# Patient Record
Sex: Female | Born: 2000 | Race: Black or African American | Hispanic: No | Marital: Single | Smoking: Never smoker
Health system: Southern US, Community
[De-identification: ages and names within clinical notes are randomized; demographics above are authoritative.]

## PROBLEM LIST (undated history)

## (undated) DIAGNOSIS — F32A Depression, unspecified: Secondary | ICD-10-CM

## (undated) DIAGNOSIS — N390 Urinary tract infection, site not specified: Secondary | ICD-10-CM

## (undated) DIAGNOSIS — D649 Anemia, unspecified: Secondary | ICD-10-CM

## (undated) DIAGNOSIS — A749 Chlamydial infection, unspecified: Secondary | ICD-10-CM

## (undated) HISTORY — DX: Chlamydial infection, unspecified: A74.9

## (undated) HISTORY — PX: NO PAST SURGERIES: SHX2092

## (undated) HISTORY — DX: Depression, unspecified: F32.A

---
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
## 2020-02-14 ENCOUNTER — Ambulatory Visit: Payer: Self-pay

## 2020-02-15 ENCOUNTER — Ambulatory Visit: Payer: Self-pay

## 2020-02-19 ENCOUNTER — Other Ambulatory Visit: Payer: Self-pay

## 2020-02-19 ENCOUNTER — Ambulatory Visit
Admission: RE | Admit: 2020-02-19 | Discharge: 2020-02-19 | Disposition: A | Discharge status: Home / Self Care | Payer: Medicaid Other | Source: Ambulatory Visit | Attending: Emergency Medicine | Admitting: Emergency Medicine

## 2020-02-19 VITALS — BP 138/78 | HR 88 | Temp 98.3°F | Resp 20

## 2020-02-19 DIAGNOSIS — N3001 Acute cystitis with hematuria: Secondary | ICD-10-CM | POA: Diagnosis present

## 2020-02-19 DIAGNOSIS — Z113 Encounter for screening for infections with a predominantly sexual mode of transmission: Secondary | ICD-10-CM | POA: Insufficient documentation

## 2020-02-19 LAB — POCT URINALYSIS DIP (MANUAL ENTRY)
Bilirubin, UA: NEGATIVE
Glucose, UA: NEGATIVE mg/dL
Ketones, POC UA: NEGATIVE mg/dL
Nitrite, UA: NEGATIVE
Spec Grav, UA: >=1.03 — AB (ref 1.010–1.025)
Urobilinogen, UA: 0.2 E.U./dL
pH, UA: 6.5 (ref 5.0–8.0)

## 2020-02-19 LAB — POCT URINE PREGNANCY: Preg Test, Ur: NEGATIVE

## 2020-02-19 MED ORDER — CEPHALEXIN 500 MG PO CAPS: 500.0000 mg | ORAL_CAPSULE | Freq: Two times a day (BID) | ORAL | 0 refills | Status: AC

## 2020-02-19 NOTE — ED Provider Notes (Signed)
[name]    CSN: [id number] Arrival date & time: 02/19/20  1608      History   Chief Complaint Chief Complaint  Patient presents with  . Urinary Frequency    X 1 week    HPI Jill Neal is a 19 y.o. female  Sending for possible UTI.  Endorsing urinary frequency, urgency, dysuria.  Has had this feeling for the last few days.  Also requesting STI check-currently asymptomatic.  Past Medical History:  Diagnosis Date  . Anemia   . Anemia in pregnancy, third trimester 03/06/2019   Hgb 9.8 at 28 week visit Rx for Ferrous Sulfate ordered  . Chlamydia   . Post-dates pregnancy 05/29/2019  . Pregnancy affected by fetal growth restriction 05/29/2019  . Short cervical length during pregnancy in second trimester 01/20/2019   2.7 cm on 01/20/19  . Supervision of low-risk pregnancy 11/12/2018   BABYSCRIPTS PATIENT: [x ]Initial [x ]12 [x20 [x] 28 [ ] 32 [ ] 36 [ ] 38 [ ] 39 [ ] 40  Nursing Staff Provider Office Location  [facility] Dating   LMP and 6.1 week Language   English Anatomy   Normal  Flu Vaccine  12/04/2018 Genetic Screen   NIPS: low risk  AFP:   negative  TDaP vaccine   03/05/19 Hgb A1C or  GTT Early 5.2 Third trimester 79-108-93 Rhogam   N/A   LAB RESULTS  Feeding Plan  Bottle Bloo  . UTI (lower urinary tract infection)     Patient Active Problem List   Diagnosis Date Noted  . 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 Depression 12/03/2018    Past Surgical History:  Procedure Laterality Date  . NO PAST SURGERIES      OB History    Gravida  1   Para  1   Term  1   Preterm  0   AB  0   Living  1     SAB  0   IAB  0   Ectopic  0   Multiple  0   Live Births  1            Home Medications    Prior to Admission medications   Medication Sig Start Date End Date Taking? Authorizing Provider  cephALEXin (KEFLEX) 500 MG capsule Take 1 capsule (500 mg total) by mouth 2 (two) times daily for 3 days. 02/19/20 02/22/20  Hall-Potvin,  05/03/19, PA-C  ferrous sulfate 325 (65 FE) MG tablet Take 1 tablet (325 mg total) by mouth 3 (three) times daily with meals. Patient not taking: No sig reported 03/10/19   01/15/2019, DO  montelukast (SINGULAIR) 10 MG tablet Take 1 tablet (10 mg total) by mouth at bedtime. Patient not taking: Reported on 10/03/2018 11/22/17 10/03/18  [place], MD    Family History Family History  Problem Relation Age of Onset  . Hypertension Other   . Healthy Mother   . Healthy Father     Social History Social History   Tobacco Use  . Smoking status: Never Smoker  . Smokeless tobacco: Never Used  Vaping Use  . Vaping Use: Never used  Substance Use Topics  . Alcohol use: No    Comment: occasional  . Drug use: Not Currently    Types: Marijuana    Comment: last used July     Allergies   Patient has no known allergies.   Review of Systems Review of Systems  Constitutional: Negative for fatigue and fever.  Respiratory: Negative for cough and shortness of breath.   Cardiovascular: Negative for chest pain and palpitations.  Gastrointestinal: Negative for constipation and diarrhea.  Genitourinary: Positive for dysuria, frequency and urgency. Negative for flank pain, hematuria, pelvic pain, vaginal bleeding, vaginal discharge and vaginal pain.     Physical Exam Triage Vital Signs ED Triage Vitals  Enc Vitals Group     BP 02/19/20 1625 138/78     Pulse Rate 02/19/20 1625 88     Resp 02/19/20 1625 20     Temp 02/19/20 1625 98.3 F (36.8 C)     Temp Source 02/19/20 1625 Oral     SpO2 02/19/20 1625 97 %     Weight --      Height --      Head Circumference --      Peak Flow --      Pain Score 02/19/20 1630 0     Pain Loc --      Pain Edu? --      Excl. in GC? --    No data found.  Updated Vital Signs BP 138/78 (BP Location: Left Arm)   Pulse 88   Temp 98.3 F (36.8 C) (Oral)   Resp 20   SpO2 97%   Visual Acuity Right Eye Distance:   Left Eye Distance:    Bilateral Distance:    Right Eye Near:   Left Eye Near:    Bilateral Near:     Physical Exam Constitutional:      General: She is not in acute distress. HENT:     Head: Normocephalic and atraumatic.  Eyes:     General: No scleral icterus.    Pupils: Pupils are equal, round, and reactive to light.  Cardiovascular:     Rate and Rhythm: Normal rate.  Pulmonary:     Effort: Pulmonary effort is normal.  Abdominal:     General: Bowel sounds are normal.     Palpations: Abdomen is soft.     Tenderness: There is no abdominal tenderness. There is no right CVA tenderness, left CVA tenderness or guarding.  Genitourinary:    Comments: Patient declined, self-swab performed Skin:    Coloration: Skin is not jaundiced or pale.  Neurological:     Mental Status: She is alert and oriented to person, place, and time.      UC Treatments / Results  Labs (all labs ordered are listed, but only abnormal results are displayed) Labs Reviewed  POCT URINALYSIS DIP (MANUAL ENTRY) - Abnormal; Notable for the following components:      Result Value   Clarity, UA cloudy (*)    Spec Grav, UA >=1.030 (*)    Blood, UA trace-intact (*)    Protein Ur, POC trace (*)    Leukocytes, UA Small (1+) (*)    All other components within normal limits  URINE CULTURE  POCT URINE PREGNANCY  CERVICOVAGINAL ANCILLARY ONLY    EKG   Radiology No results found.  Procedures Procedures (including critical care time)  Medications Ordered in UC Medications - No data to display  Initial Impression / Assessment and Plan / UC Course  I have reviewed the triage vital signs and the nursing notes.  Pertinent labs & imaging results that were available during my care of the patient were reviewed by me and considered in my medical decision making (see chart for details).     Urine as above, will treat empirically for UTI given symptoms.  Culture pending.  STI cytology  pending as well.  Return precautions discussed,  pt verbalized understanding and is agreeable to plan. Final Clinical Impressions(s) / UC Diagnoses   Final diagnoses:  Acute cystitis with hematuria     Discharge Instructions     Take antibiotic twice daily with food. Important to drink plenty of water throughout the day. May take Azo as needed for burning sensation. Return for worsening urinary symptoms, blood in urine, abdominal or back pain, fever.    ED Prescriptions    Medication Sig Dispense Auth. Provider   cephALEXin (KEFLEX) 500 MG capsule Take 1 capsule (500 mg total) by mouth 2 (two) times daily for 3 days. 6 capsule Hall-Potvin, [place], PA-C     PDMP not reviewed this encounter.   Hall-Potvin, [place], [place] 02/19/20 1700

## 2020-02-19 NOTE — ED Triage Notes (Signed)
Patient states she has a odd feeling when urinating and has some frequency. Pt also requested a STI check. Pt is aox4 and ambulatory.

## 2020-02-19 NOTE — Discharge Instructions (Addendum)
Take antibiotic twice daily with food. °Important to drink plenty of water throughout the day. °May take Azo as needed for burning sensation. °Return for worsening urinary symptoms, blood in urine, abdominal or back pain, fever. °

## 2020-02-23 ENCOUNTER — Telehealth: Payer: Self-pay

## 2020-02-23 NOTE — Telephone Encounter (Signed)
Lab unable to run urine culture. Per Jill Neal patient should only return for recollect if still symptomatic. Attempted to reach patient. No answer.

## 2020-02-24 LAB — CERVICOVAGINAL ANCILLARY ONLY
Chlamydia: NEGATIVE
Comment: NEGATIVE
Comment: NEGATIVE
Comment: NORMAL
Neisseria Gonorrhea: NEGATIVE
Trichomonas: NEGATIVE

## 2020-03-05 ENCOUNTER — Ambulatory Visit: Payer: Self-pay

## 2020-03-07 ENCOUNTER — Ambulatory Visit: Payer: Self-pay

## 2020-03-08 ENCOUNTER — Ambulatory Visit (INDEPENDENT_AMBULATORY_CARE_PROVIDER_SITE_OTHER): Payer: Medicaid Other

## 2020-03-08 ENCOUNTER — Ambulatory Visit
Admission: RE | Admit: 2020-03-08 | Discharge: 2020-03-08 | Disposition: A | Discharge status: Home / Self Care | Payer: Medicaid Other | Source: Ambulatory Visit | Attending: Internal Medicine | Admitting: Internal Medicine

## 2020-03-08 ENCOUNTER — Other Ambulatory Visit: Payer: Self-pay

## 2020-03-08 VITALS — BP 107/67 | HR 80 | Temp 98.5°F | Resp 16

## 2020-03-08 DIAGNOSIS — B3731 Acute candidiasis of vulva and vagina: Secondary | ICD-10-CM

## 2020-03-08 DIAGNOSIS — W19XXXA Unspecified fall, initial encounter: Secondary | ICD-10-CM

## 2020-03-08 DIAGNOSIS — B373 Candidiasis of vulva and vagina: Secondary | ICD-10-CM | POA: Diagnosis not present

## 2020-03-08 DIAGNOSIS — S93601A Unspecified sprain of right foot, initial encounter: Secondary | ICD-10-CM

## 2020-03-08 DIAGNOSIS — M79671 Pain in right foot: Secondary | ICD-10-CM | POA: Diagnosis not present

## 2020-03-08 IMAGING — DX DG FOOT COMPLETE 3+V*R*
3 series · 3 of 3 positions shown · non-contrast
Comparison: None.

CLINICAL DATA: 19-year-old female with fall.

EXAM:
RIGHT FOOT COMPLETE - 3+ VIEW

[foot supine dp]
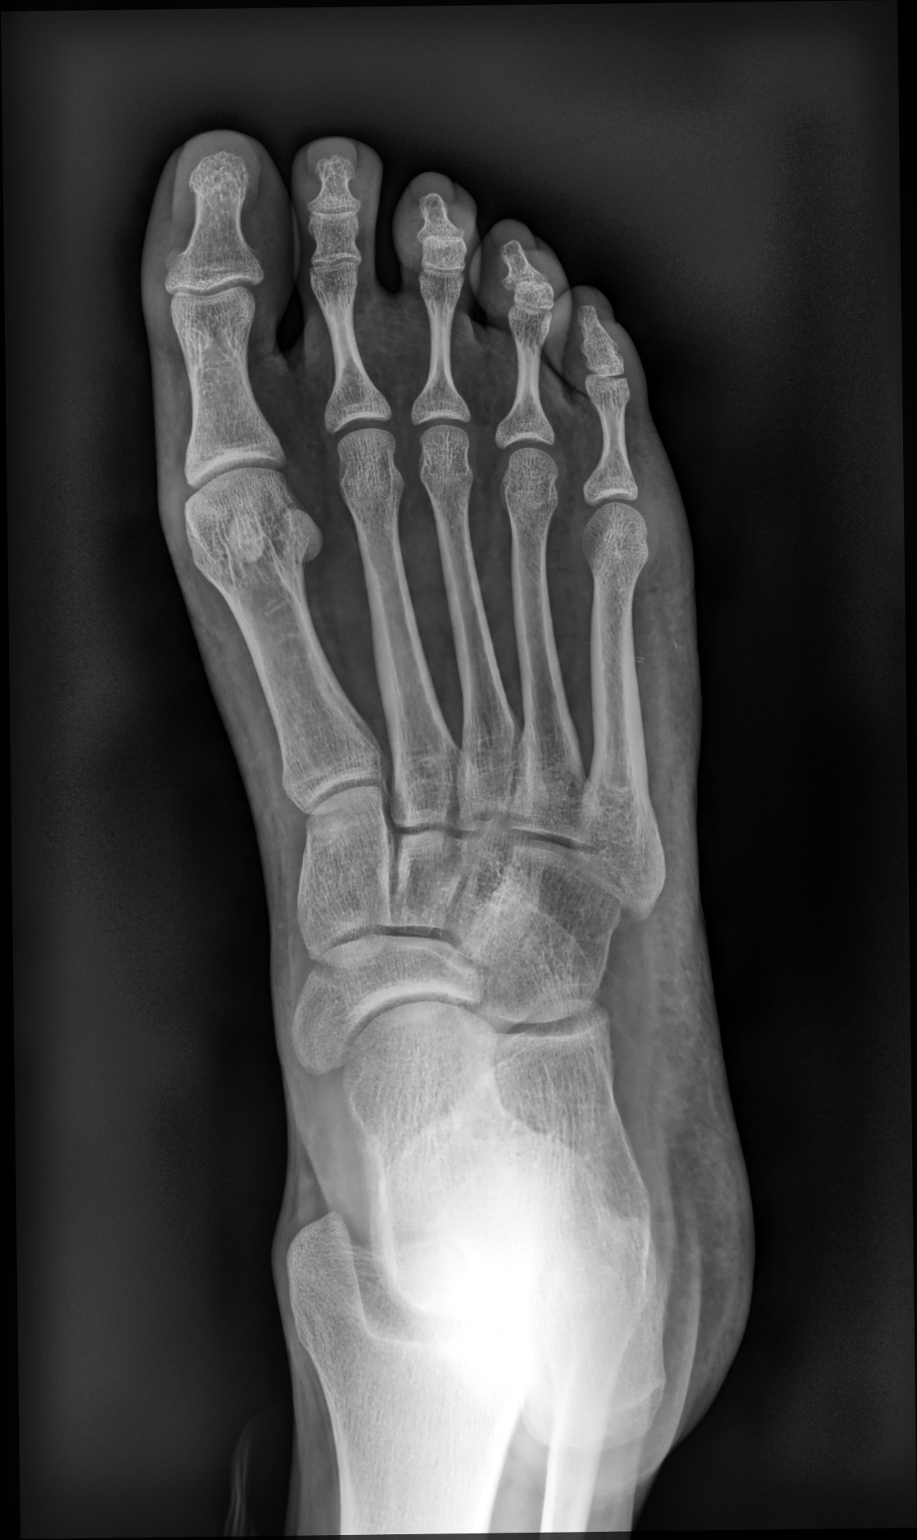

[foot medial oblique]
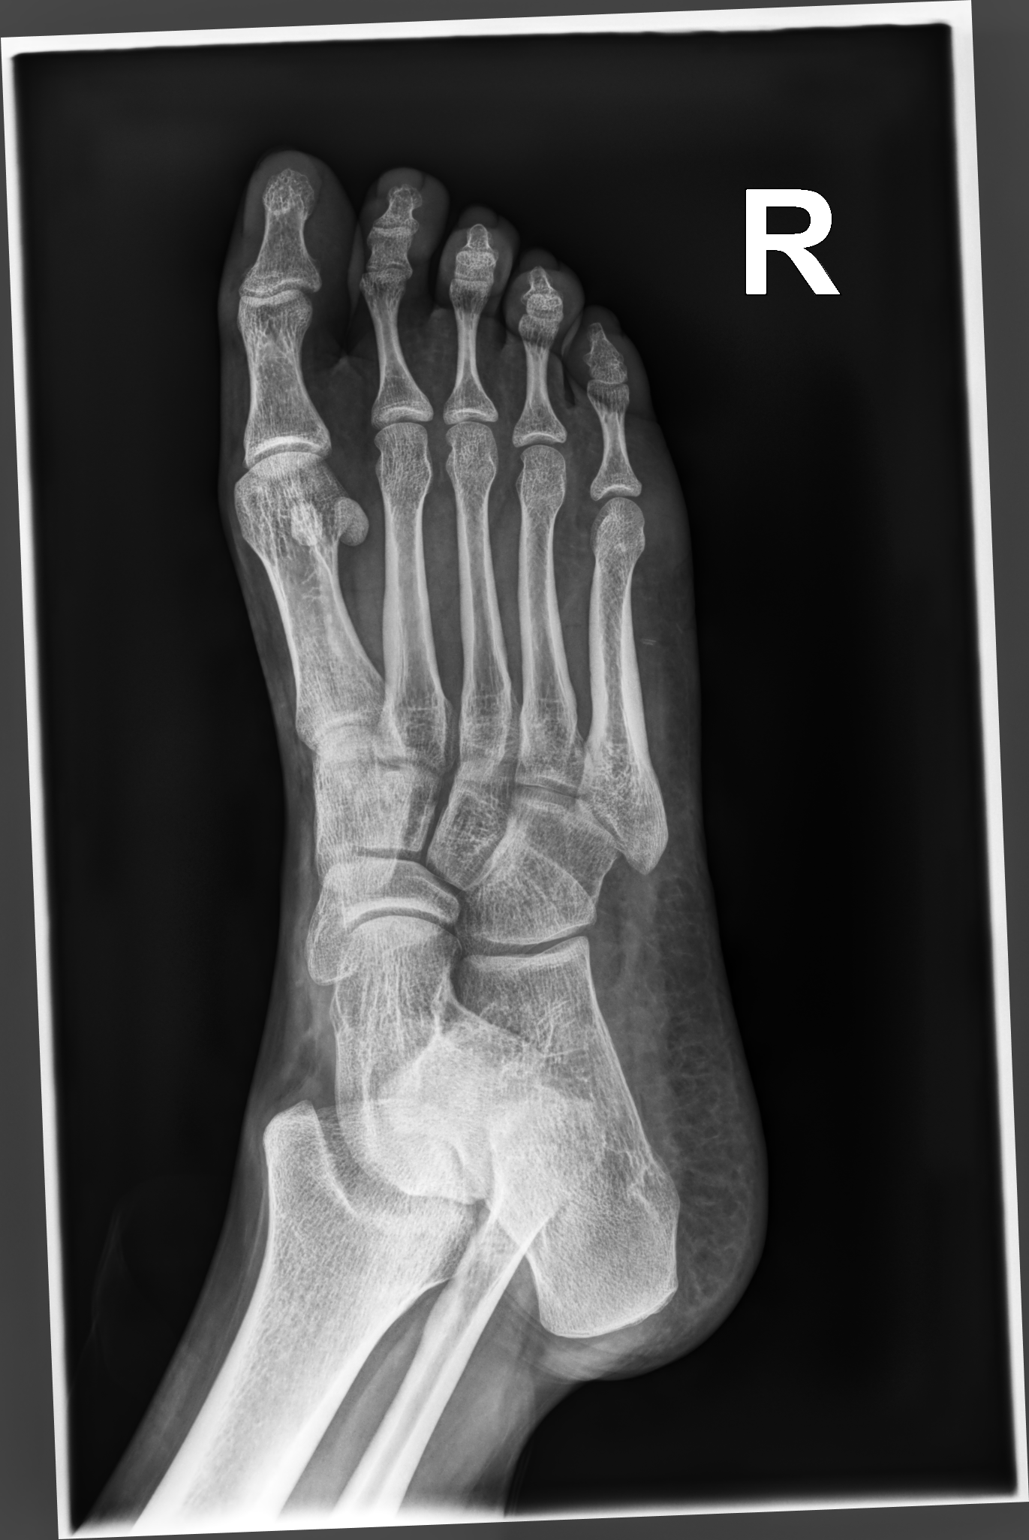

[foot supine lat]
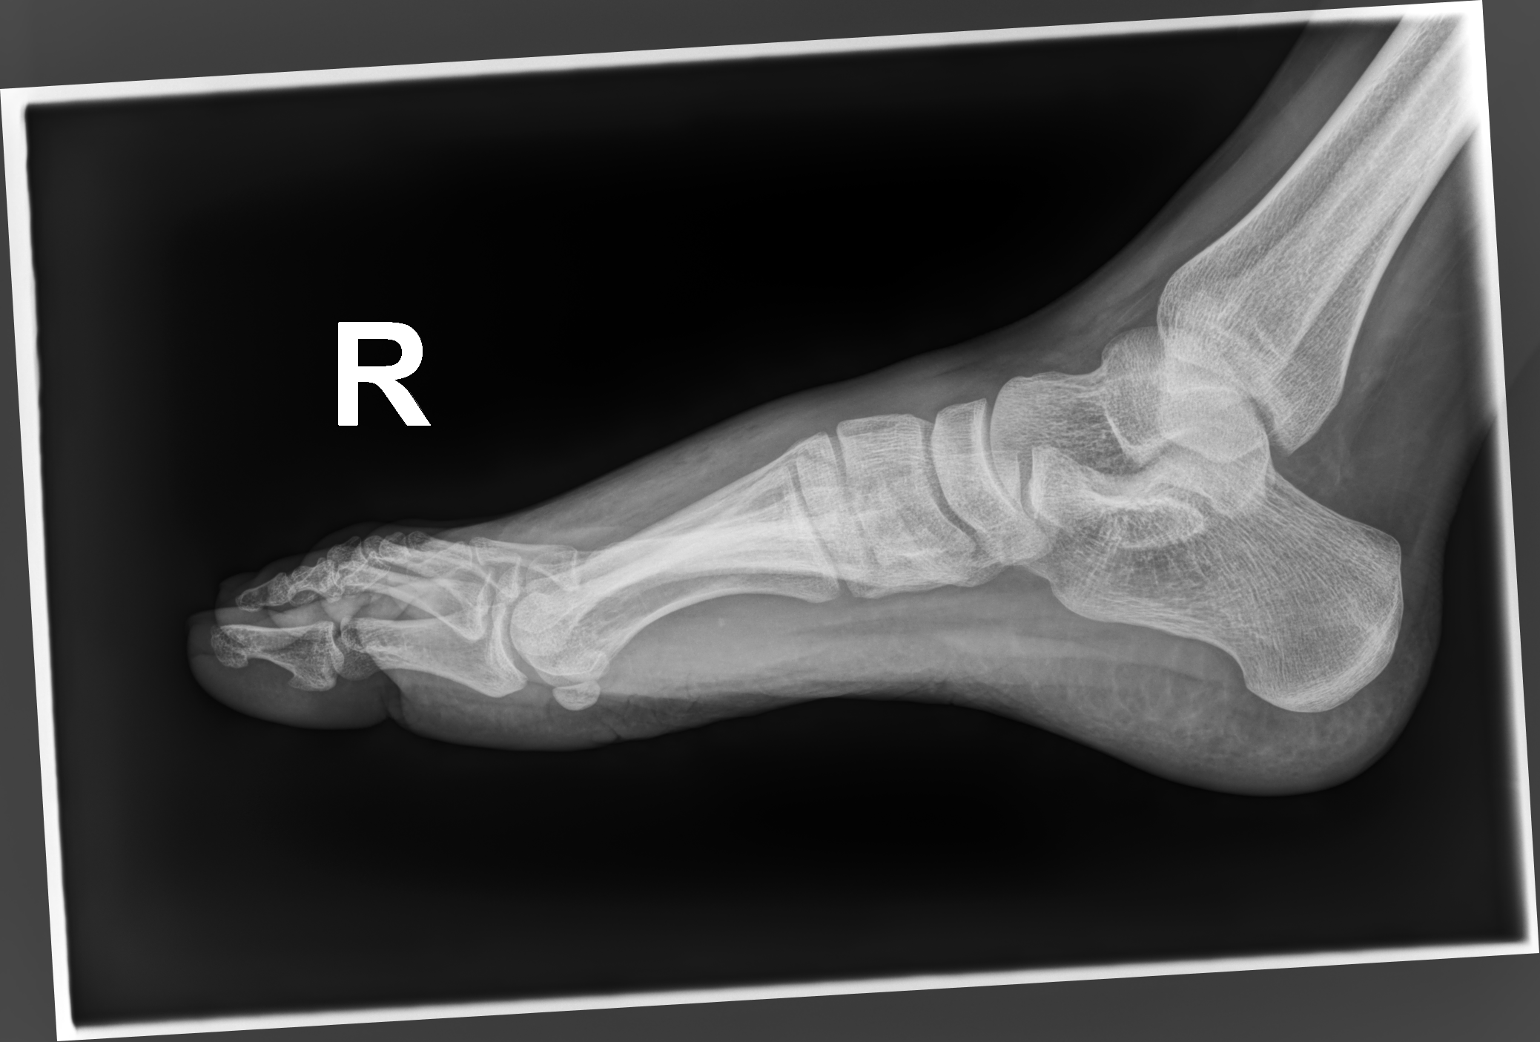

[3 of 3 positions shown; findings below may reference images not displayed]

FINDINGS: There is no acute fracture or dislocation. No arthritic changes.
Mild soft tissue swelling of the dorsum of the foot. No radiopaque
foreign object or soft tissue gas.
IMPRESSION: No acute fracture or dislocation.

## 2020-03-08 MED ORDER — IBUPROFEN 600 MG PO TABS: 600.0000 mg | ORAL_TABLET | Freq: Four times a day (QID) | ORAL | 0 refills | Status: DC | PRN

## 2020-03-08 MED ORDER — FLUCONAZOLE 150 MG PO TABS: 150.0000 mg | ORAL_TABLET | Freq: Once | ORAL | 0 refills | Status: AC

## 2020-03-08 NOTE — ED Provider Notes (Signed)
[name]    CSN: [id number] Arrival date & time: 03/08/20  1709      History   Chief Complaint Chief Complaint  Patient presents with  . Foot Injury  . Vaginal Itching         HPI Jill Neal is a 20 y.o. female comes to the urgent care with right foot pain of 1 day duration.  Patient inverted her right foot was walking.  She may have had a cracking sound.  Patient experienced moderate severity pain and swelling soon after the event.  No known relieving factors.  She is able to bear weight on the right foot.  No radiation of pain into the toes.  Patient complains of vaginal itching of 3 days duration.  She is sexually active and engages in unprotected sexual intercourse.  She had a negative STD eval about 2 week ago.  HPI  Past Medical History:  Diagnosis Date  . Anemia   . Anemia in pregnancy, third trimester 03/06/2019   Hgb 9.8 at 28 week visit Rx for Ferrous Sulfate ordered  . Chlamydia   . Post-dates pregnancy 05/29/2019  . Pregnancy affected by fetal growth restriction 05/29/2019  . Short cervical length during pregnancy in second trimester 01/20/2019   2.7 cm on 01/20/19  . Supervision of low-risk pregnancy 11/12/2018   BABYSCRIPTS PATIENT: [x ]Initial [x ]12 [x20 [x] 28 [ ] 32 [ ] 36 [ ] 38 [ ] 39 [ ] 40  Nursing Staff Provider Office Location  [facility] Dating   LMP and 6.1 week Language   English Anatomy   Normal  Flu Vaccine  12/04/2018 Genetic Screen   NIPS: low risk  AFP:   negative  TDaP vaccine   03/05/19 Hgb A1C or  GTT Early 5.2 Third trimester 79-108-93 Rhogam   N/A   LAB RESULTS  Feeding Plan  Bottle Bloo  . UTI (lower urinary tract infection)     Patient Active Problem List   Diagnosis Date Noted  . 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 Depression 12/03/2018    Past Surgical History:  Procedure Laterality Date  . NO PAST SURGERIES      OB History    Gravida  1   Para  1   Term  1   Preterm  0   AB  0   Living  1      SAB  0   IAB  0   Ectopic  0   Multiple  0   Live Births  1            Home Medications    Prior to Admission medications   Medication Sig Start Date End Date Taking? Authorizing Provider  fluconazole (DIFLUCAN) 150 MG tablet Take 1 tablet (150 mg total) by mouth once for 1 dose. Take second dose if no improvement 03/08/20 03/08/20 Yes Kyren Knick, 05/03/19, MD  ibuprofen (ADVIL) 600 MG tablet Take 1 tablet (600 mg total) by mouth every 6 (six) hours as needed. 03/08/20  Yes Lowell Mcgurk, 01/15/2019, MD  ferrous sulfate 325 (65 FE) MG tablet Take 1 tablet (325 mg total) by mouth 3 (three) times daily with meals. Patient not taking: No sig reported 03/10/19 03/08/20  05/06/20, DO  montelukast (SINGULAIR) 10 MG tablet Take 1 tablet (10 mg total) by mouth at bedtime. Patient not taking: Reported on 10/03/2018 11/22/17 10/03/18  05/08/19, MD    Family History Family History  Problem Relation Age of Onset  .  Hypertension Other   . Healthy Mother   . Healthy Father     Social History Social History   Tobacco Use  . Smoking status: Never Smoker  . Smokeless tobacco: Never Used  Vaping Use  . Vaping Use: Never used  Substance Use Topics  . Alcohol use: No    Comment: occasional  . Drug use: Not Currently    Types: Marijuana    Comment: last used July     Allergies   Patient has no known allergies.   Review of Systems Review of Systems  Constitutional: Negative.   Respiratory: Negative.   Musculoskeletal: Positive for arthralgias and joint swelling. Negative for gait problem and myalgias.  Skin: Negative.  Negative for pallor, rash and wound.     Physical Exam Triage Vital Signs ED Triage Vitals  Enc Vitals Group     BP 03/08/20 1831 107/67     Pulse Rate 03/08/20 1831 80     Resp 03/08/20 1831 16     Temp 03/08/20 1831 98.5 F (36.9 C)     Temp Source 03/08/20 1831 Oral     SpO2 03/08/20 1831 98 %     Weight --      Height --      Head  Circumference --      Peak Flow --      Pain Score 03/08/20 1829 5     Pain Loc --      Pain Edu? --      Excl. in GC? --    No data found.  Updated Vital Signs BP 107/67 (BP Location: Right Arm)   Pulse 80   Temp 98.5 F (36.9 C) (Oral)   Resp 16   LMP 02/26/2020   SpO2 98%   Visual Acuity Right Eye Distance:   Left Eye Distance:   Bilateral Distance:    Right Eye Near:   Left Eye Near:    Bilateral Near:     Physical Exam Vitals and nursing note reviewed.  Constitutional:      General: She is not in acute distress.    Appearance: Normal appearance. She is not ill-appearing.  Cardiovascular:     Pulses: Normal pulses.     Heart sounds: Normal heart sounds.  Pulmonary:     Effort: Pulmonary effort is normal.     Breath sounds: Normal breath sounds.  Musculoskeletal:     Comments: Tenderness over the fifth distal metatarsal joint of the right foot.  Mild swelling.  No erythema or bruising.  Skin:    General: Skin is warm and dry.  Neurological:     Mental Status: She is alert.      UC Treatments / Results  Labs (all labs ordered are listed, but only abnormal results are displayed) Labs Reviewed - No data to display  EKG   Radiology DG Foot Complete Right  Result Date: 03/08/2020 CLINICAL DATA:  20 year old female with fall. EXAM: RIGHT FOOT COMPLETE - 3+ VIEW COMPARISON:  None. FINDINGS: There is no acute fracture or dislocation. No arthritic changes. Mild soft tissue swelling of the dorsum of the foot. No radiopaque foreign object or soft tissue gas. IMPRESSION: No acute fracture or dislocation. Electronically Signed   By: Elgie Collard M.D.   On: 03/08/2020 18:58    Procedures Procedures (including critical care time)  Medications Ordered in UC Medications - No data to display  Initial Impression / Assessment and Plan / UC Course  I have reviewed the triage  vital signs and the nursing notes.  Pertinent labs & imaging results that were  available during my care of the patient were reviewed by me and considered in my medical decision making (see chart for details).     1.  Acute vaginitis likely vaginal yeast infection: Fluconazole 150 mg x 1 dose, may repeat in 72 hours if no improvement in symptoms Cervical vaginal swab for GC/chlamydia/trichomonas/vaginal yeast/bacterial vaginosis Safe sex practices advised  2.  Right foot sprain: Ibuprofen as needed for pain Ace bandage right foot Gentle range of motion exercises X-ray of the right foot is negative for fracture. Return precautions given. Final Clinical Impressions(s) / UC Diagnoses   Final diagnoses:  Vaginal yeast infection  Foot sprain, right, initial encounter     Discharge Instructions     Gentle range of motion exercises Take medications as directed If you have worsening symptoms please return to urgent care to be reevaluated.   ED Prescriptions    Medication Sig Dispense Auth. Provider   ibuprofen (ADVIL) 600 MG tablet Take 1 tablet (600 mg total) by mouth every 6 (six) hours as needed. 30 tablet Robertha Staples, Britta Mccreedy, MD   fluconazole (DIFLUCAN) 150 MG tablet Take 1 tablet (150 mg total) by mouth once for 1 dose. Take second dose if no improvement 2 tablet Solace Wendorff, Britta Mccreedy, MD     PDMP not reviewed this encounter.   Merrilee Jansky, MD 03/11/20 [phone]

## 2020-03-08 NOTE — Discharge Instructions (Addendum)
Gentle range of motion exercises Take medications as directed If you have worsening symptoms please return to urgent care to be reevaluated.

## 2020-03-08 NOTE — ED Triage Notes (Signed)
Pt here for right foot inj onset last night.... reports she inverted her foot and felt a "crack"  Sx today include pain and swelling  Also reports she was seen here for a UTI on 12/23 and was given antibiotics but believes she may now have a yeast  Having vaginal itching x3 days   A&O x4... NAD.Marland Kitchen. ambulatory

## 2020-05-17 ENCOUNTER — Ambulatory Visit (HOSPITAL_COMMUNITY): Payer: Self-pay

## 2020-05-18 ENCOUNTER — Encounter (HOSPITAL_COMMUNITY): Payer: Self-pay

## 2020-05-18 ENCOUNTER — Ambulatory Visit (HOSPITAL_COMMUNITY)
Admission: RE | Admit: 2020-05-18 | Discharge: 2020-05-18 | Disposition: A | Discharge status: Home / Self Care | Payer: Medicaid Other | Source: Ambulatory Visit | Attending: Family Medicine | Admitting: Family Medicine

## 2020-05-18 ENCOUNTER — Other Ambulatory Visit: Payer: Self-pay

## 2020-05-18 VITALS — BP 104/71 | HR 86 | Temp 98.9°F | Resp 18

## 2020-05-18 DIAGNOSIS — N898 Other specified noninflammatory disorders of vagina: Secondary | ICD-10-CM | POA: Diagnosis present

## 2020-05-18 LAB — POCT URINALYSIS DIPSTICK, ED / UC
Bilirubin Urine: NEGATIVE
Glucose, UA: NEGATIVE mg/dL
Ketones, ur: NEGATIVE mg/dL
Nitrite: NEGATIVE
Protein, ur: NEGATIVE mg/dL
Specific Gravity, Urine: >=1.03 (ref 1.005–1.030)
Urobilinogen, UA: 0.2 mg/dL (ref 0.0–1.0)
pH: 6.5 (ref 5.0–8.0)

## 2020-05-18 LAB — POC URINE PREG, ED: Preg Test, Ur: NEGATIVE

## 2020-05-18 MED ORDER — CEFTRIAXONE SODIUM 500 MG IJ SOLR
INTRAMUSCULAR | Status: AC
  Filled 2020-05-18: qty 500

## 2020-05-18 MED ORDER — CEFTRIAXONE SODIUM 500 MG IJ SOLR
500.0000 mg | Freq: Once | INTRAMUSCULAR | Status: AC
  Administered 2020-05-18: 500 mg via INTRAMUSCULAR

## 2020-05-18 MED ORDER — DOXYCYCLINE HYCLATE 100 MG PO CAPS: 100.0000 mg | ORAL_CAPSULE | Freq: Two times a day (BID) | ORAL | 0 refills | Status: DC

## 2020-05-18 NOTE — ED Triage Notes (Signed)
Pt presents with abnormal vaginal odor for past few days.

## 2020-05-18 NOTE — ED Provider Notes (Signed)
[facility]   [id number] 05/18/20 Arrival Time: 1404  ASSESSMENT & PLAN:  1. Vaginal discharge       Discharge Instructions     You have been given the following today for treatment of suspected gonorrhea and/or chlamydia:  cefTRIAXone (ROCEPHIN) injection 500 mg  Please pick up your prescription for doxycycline 100 mg and begin taking twice daily for the next seven (7) days.  Even though we have treated you today, we have sent testing for sexually transmitted infections and a culture of your urine sample. We will notify you of any positive results once they are received. If required, we will prescribe any medications you might need.  Please refrain from all sexual activity for at least the next seven days.     Without s/s of PID.  Labs Reviewed  POCT URINALYSIS DIPSTICK, ED / UC - Abnormal; Notable for the following components:      Result Value   Hgb urine dipstick TRACE (*)    Leukocytes,Ua LARGE (*)    All other components within normal limits  URINE CULTURE  POC URINE PREG, ED  CERVICOVAGINAL ANCILLARY ONLY    Pending: Unresulted Labs (From admission, onward)          Start     Ordered   05/18/20 1535  Urine culture  ONCE - STAT,   STAT        05/18/20 1534           Will notify of any positive results. Instructed to refrain from sexual activity for at least seven days.  Reviewed expectations re: course of current medical issues. Questions answered. Outlined signs and symptoms indicating need for more acute intervention. Patient verbalized understanding. After Visit Summary given.   SUBJECTIVE:  Jill Neal is a 20 y.o. female who presents with complaint of vaginal discharge. Onset gradual. First noticed few d ago. "Hard to describe discharge but there's an odor". No specific aggravating or alleviating factors reported. Denies: urinary frequency, dysuria and gross hematuria. Afebrile. No abdominal or pelvic pain. Normal PO intake  wihout n/v. No genital rashes or lesions. Reports that she is sexually active with single female partner. OTC treatment: none.  Patient's last menstrual period was 05/10/2020.   OBJECTIVE:  Vitals:   05/18/20 1448  BP: 104/71  Pulse: 86  Resp: 18  Temp: 98.9 F (37.2 C)  TempSrc: Oral  SpO2: 96%     General appearance: alert, cooperative, appears stated age and no distress Lungs: unlabored respirations; speaks full sentences without difficulty Back: no CVA tenderness; FROM at waist Abdomen: soft, non-tender GU: deferred Skin: warm and dry Psychological: alert and cooperative; normal mood and affect.  Results for orders placed or performed during the hospital encounter of 05/18/20  POC Urinalysis dipstick  Result Value Ref Range   Glucose, UA NEGATIVE NEGATIVE mg/dL   Bilirubin Urine NEGATIVE NEGATIVE   Ketones, ur NEGATIVE NEGATIVE mg/dL   Specific Gravity, Urine >=1.030 1.005 - 1.030   Hgb urine dipstick TRACE (A) NEGATIVE   pH 6.5 5.0 - 8.0   Protein, ur NEGATIVE NEGATIVE mg/dL   Urobilinogen, UA 0.2 0.0 - 1.0 mg/dL   Nitrite NEGATIVE NEGATIVE   Leukocytes,Ua LARGE (A) NEGATIVE  POC urine pregnancy  Result Value Ref Range   Preg Test, Ur NEGATIVE NEGATIVE    Labs Reviewed  POCT URINALYSIS DIPSTICK, ED / UC - Abnormal; Notable for the following components:      Result Value   Hgb urine dipstick TRACE (*)  Leukocytes,Ua LARGE (*)    All other components within normal limits  URINE CULTURE  POC URINE PREG, ED  CERVICOVAGINAL ANCILLARY ONLY    No Known Allergies  Past Medical History:  Diagnosis Date  . Anemia   . Anemia in pregnancy, third trimester 03/06/2019   Hgb 9.8 at 28 week visit Rx for Ferrous Sulfate ordered  . Chlamydia   . Post-dates pregnancy 05/29/2019  . Pregnancy affected by fetal growth restriction 05/29/2019  . Short cervical length during pregnancy in second trimester 01/20/2019   2.7 cm on 01/20/19  . Supervision of low-risk pregnancy  11/12/2018   BABYSCRIPTS PATIENT: [x ]Initial [x ]12 [x20 [x] 28 [ ] 32 [ ] 36 [ ] 38 [ ] 39 [ ] 40  Nursing Staff Provider Office Location  [facility] Dating   LMP and 6.1 week Language   English Anatomy   Normal  Flu Vaccine  12/04/2018 Genetic Screen   NIPS: low risk  AFP:   negative  TDaP vaccine   03/05/19 Hgb A1C or  GTT Early 5.2 Third trimester 79-108-93 Rhogam   N/A   LAB RESULTS  Feeding Plan  Bottle Bloo  . UTI (lower urinary tract infection)    Family History  Problem Relation Age of Onset  . Hypertension Other   . Healthy Mother   . Healthy Father    Social History   Socioeconomic History  . Marital status: Single    Spouse name: Not on file  . Number of children: Not on file  . Years of education: Not on file  . Highest education level: Not on file  Occupational History  . Not on file  Tobacco Use  . Smoking status: Never Smoker  . Smokeless tobacco: Never Used  Vaping Use  . Vaping Use: Never used  Substance and Sexual Activity  . Alcohol use: No    Comment: occasional  . Drug use: Not Currently    Types: Marijuana    Comment: last used July  . Sexual activity: Yes    Birth control/protection: None  Other Topics Concern  . Not on file  Social History Narrative  . Not on file   Social Determinants of Health   Financial Resource Strain: Not on file  Food Insecurity: Not on file  Transportation Needs: Not on file  Physical Activity: Not on file  Stress: Not on file  Social Connections: Not on file  Intimate Partner Violence: Not on file          [place], MD 05/18/20 [phone]

## 2020-05-18 NOTE — Discharge Instructions (Signed)
You have been given the following today for treatment of suspected gonorrhea and/or chlamydia:  cefTRIAXone (ROCEPHIN) injection 500 mg  Please pick up your prescription for doxycycline 100 mg and begin taking twice daily for the next seven (7) days.  Even though we have treated you today, we have sent testing for sexually transmitted infections and a culture of your urine sample. We will notify you of any positive results once they are received. If required, we will prescribe any medications you might need.  Please refrain from all sexual activity for at least the next seven days.

## 2020-05-19 ENCOUNTER — Telehealth (HOSPITAL_COMMUNITY): Payer: Self-pay | Admitting: Emergency Medicine

## 2020-05-19 LAB — CERVICOVAGINAL ANCILLARY ONLY
Bacterial Vaginitis (gardnerella): POSITIVE — AB
Candida Glabrata: NEGATIVE
Candida Vaginitis: NEGATIVE
Chlamydia: NEGATIVE
Comment: NEGATIVE
Comment: NEGATIVE
Comment: NEGATIVE
Comment: NEGATIVE
Comment: NEGATIVE
Comment: NORMAL
Neisseria Gonorrhea: NEGATIVE
Trichomonas: NEGATIVE

## 2020-05-19 MED ORDER — METRONIDAZOLE 500 MG PO TABS: 500.0000 mg | ORAL_TABLET | Freq: Two times a day (BID) | ORAL | 0 refills | Status: DC

## 2020-05-20 LAB — URINE CULTURE

## 2020-06-02 ENCOUNTER — Ambulatory Visit (HOSPITAL_COMMUNITY): Payer: Self-pay

## 2020-06-25 ENCOUNTER — Ambulatory Visit (HOSPITAL_COMMUNITY): Payer: Self-pay

## 2020-08-05 ENCOUNTER — Ambulatory Visit: Payer: Medicaid Other

## 2020-08-08 ENCOUNTER — Ambulatory Visit: Payer: Medicaid Other

## 2020-08-09 ENCOUNTER — Ambulatory Visit: Payer: Medicaid Other

## 2020-08-10 ENCOUNTER — Ambulatory Visit: Payer: Medicaid Other

## 2020-08-11 ENCOUNTER — Ambulatory Visit
Admission: RE | Admit: 2020-08-11 | Discharge: 2020-08-11 | Disposition: A | Discharge status: Home / Self Care | Payer: Medicaid Other | Source: Ambulatory Visit | Attending: Emergency Medicine | Admitting: Emergency Medicine

## 2020-08-11 ENCOUNTER — Other Ambulatory Visit: Payer: Self-pay

## 2020-08-11 VITALS — BP 113/73 | HR 82 | Temp 98.4°F | Resp 18

## 2020-08-11 DIAGNOSIS — R300 Dysuria: Secondary | ICD-10-CM | POA: Insufficient documentation

## 2020-08-11 DIAGNOSIS — N898 Other specified noninflammatory disorders of vagina: Secondary | ICD-10-CM | POA: Diagnosis present

## 2020-08-11 DIAGNOSIS — R350 Frequency of micturition: Secondary | ICD-10-CM | POA: Diagnosis present

## 2020-08-11 LAB — POCT URINALYSIS DIP (MANUAL ENTRY)
Blood, UA: NEGATIVE
Glucose, UA: NEGATIVE mg/dL
Ketones, POC UA: NEGATIVE mg/dL
Nitrite, UA: NEGATIVE
Protein Ur, POC: 100 mg/dL — AB
Spec Grav, UA: >=1.03 — AB (ref 1.010–1.025)
Urobilinogen, UA: 1 E.U./dL
pH, UA: 6.5 (ref 5.0–8.0)

## 2020-08-11 LAB — POCT URINE PREGNANCY: Preg Test, Ur: NEGATIVE

## 2020-08-11 MED ORDER — NITROFURANTOIN MONOHYD MACRO 100 MG PO CAPS: 100.0000 mg | ORAL_CAPSULE | Freq: Two times a day (BID) | ORAL | 0 refills | Status: AC

## 2020-08-11 NOTE — ED Triage Notes (Signed)
Pt presents with a 4 day h/o of dysuria, urinary frequency and urgency. Confirms vaginal discharge has been greater than normal. Denies hematuria and vaginal odor. AZO at home screen was positive for UTI Pt took two left over Amoxicillin tablets from her Mom and two of her leftover personally prescribed Metronidazole without relief.

## 2020-08-11 NOTE — Discharge Instructions (Addendum)
Begin macrobid twice daily for 5 days Vaginal swab and urine culture pending Follow up if not improving

## 2020-08-12 NOTE — ED Provider Notes (Signed)
[name]    CSN: [id number] Arrival date & time: 08/11/20  1823      History   Chief Complaint Chief Complaint  Patient presents with   Urinary Frequency   Dysuria    HPI Jill Neal is a 20 y.o. female presenting today for evaluation of possible UTI.  Reports over the past week presents urinary frequency and dysuria.  Also had associated urinary urgency.  Has had slight discharge but has been clear.  Does have history of BV as well as UTIs, patient reports symptoms feel more similar to prior UTIs.  Reports taking leftover antibiotics at home.  Denies fevers nausea or vomiting.  HPI  Past Medical History:  Diagnosis Date   Anemia    Anemia in pregnancy, third trimester 03/06/2019   Hgb 9.8 at 28 week visit Rx for Ferrous Sulfate ordered   Chlamydia    Post-dates pregnancy 05/29/2019   Pregnancy affected by fetal growth restriction 05/29/2019   Short cervical length during pregnancy in second trimester 01/20/2019   2.7 cm on 01/20/19   Supervision of low-risk pregnancy 11/12/2018   BABYSCRIPTS PATIENT: [x ]Initial [x ]12 [x20 [x] 28 [ ] 32 [ ] 36 [ ] 38 [ ] 39 [ ] 40  Nursing Staff Provider Office Location  [facility] Dating   LMP and 6.1 week Language   English Anatomy   Normal  Flu Vaccine  12/04/2018 Genetic Screen   NIPS: low risk  AFP:   negative  TDaP vaccine   03/05/19 Hgb A1C or  GTT Early 5.2 Third trimester 79-108-93 Rhogam   N/A   LAB RESULTS  Feeding Plan  Bottle Bloo   UTI (lower urinary tract infection)     Patient Active Problem List   Diagnosis Date Noted   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 Authorizing Provider  nitrofurantoin, macrocrystal-monohydrate, (MACROBID) 100 MG capsule Take 1 capsule (100 mg total) by mouth 2 (two) times daily for 5 days. 08/11/20 08/16/20 Yes [name] Shackett C, PA-C  ibuprofen (ADVIL) 600 MG tablet Take 1 tablet (600 mg total) by mouth every 6 (six) hours as needed. 03/08/20   [place], 01-16-2004, MD  ferrous sulfate 325 (65 FE) MG tablet Take 1 tablet (325 mg total) by mouth 3 (three) times daily with meals. Patient not taking: No sig reported 03/10/19 03/08/20  08/13/20, DO  montelukast (SINGULAIR) 10 MG tablet Take 1 tablet (10 mg total) by mouth at bedtime. Patient not taking: Reported on 10/03/2018 11/22/17 10/03/18  05/08/19, MD    Family History Family History  Problem Relation Age of Onset   Hypertension Other    Healthy Mother    Healthy Father     Social History Social History   Tobacco Use   Smoking status: Never   Smokeless tobacco: Never  Vaping  Use   Vaping Use: Never used  Substance Use Topics   Alcohol use: No    Comment: occasional   Drug use: Not Currently    Types: Marijuana    Comment: last used July     Allergies   Patient has no known allergies.   Review of Systems Review of Systems  Constitutional:  Negative for fever.  Respiratory:  Negative for shortness of breath.   Cardiovascular:  Negative for chest pain.  Gastrointestinal:  Negative for abdominal pain, diarrhea, nausea and vomiting.  Genitourinary:  Positive for dysuria, frequency and urgency. Negative for flank pain, genital sores, hematuria, menstrual problem, vaginal bleeding, vaginal discharge and vaginal pain.  Musculoskeletal:  Negative for back pain.  Skin:  Negative for rash.  Neurological:  Negative for dizziness, light-headedness and headaches.    Physical Exam Triage Vital Signs ED Triage Vitals  Enc Vitals Group     BP 08/11/20 1955 113/73     Pulse Rate 08/11/20 1955 82     Resp  08/11/20 1955 18     Temp 08/11/20 1955 98.4 F (36.9 C)     Temp Source 08/11/20 1955 Oral     SpO2 08/11/20 1955 97 %     Weight --      Height --      Head Circumference --      Peak Flow --      Pain Score 08/11/20 1959 8     Pain Loc --      Pain Edu? --      Excl. in GC? --    No data found.  Updated Vital Signs BP 113/73 (BP Location: Left Arm)   Pulse 82   Temp 98.4 F (36.9 C) (Oral)   Resp 18   SpO2 97%   Visual Acuity Right Eye Distance:   Left Eye Distance:   Bilateral Distance:    Right Eye Near:   Left Eye Near:    Bilateral Near:     Physical Exam Vitals and nursing note reviewed.  Constitutional:      Appearance: She is well-developed.     Comments: No acute distress  HENT:     Head: Normocephalic and atraumatic.     Nose: Nose normal.  Eyes:     Conjunctiva/sclera: Conjunctivae normal.  Cardiovascular:     Rate and Rhythm: Normal rate.  Pulmonary:     Effort: Pulmonary effort is normal. No respiratory distress.  Abdominal:     General: There is no distension.  Musculoskeletal:        General: Normal range of motion.     Cervical back: Neck supple.  Skin:    General: Skin is warm and dry.  Neurological:     Mental Status: She is alert and oriented to person, place, and time.     UC Treatments / Results  Labs (all labs ordered are listed, but only abnormal results are displayed) Labs Reviewed  POCT URINALYSIS DIP (MANUAL ENTRY) - Abnormal; Notable for the following components:      Result Value   Clarity, UA cloudy (*)    Bilirubin, UA small (*)    Spec Grav, UA >=1.030 (*)    Protein Ur, POC =100 (*)    Leukocytes, UA Trace (*)    All other components within normal limits  URINE CULTURE  POCT URINE PREGNANCY  CERVICOVAGINAL ANCILLARY ONLY    EKG   Radiology No results found.  Procedures Procedures (including critical care time)  Medications Ordered in UC Medications - No data to display  Initial Impression /  Assessment and Plan / UC Course  I have reviewed the triage vital signs and the nursing notes.  Pertinent labs & imaging results that were available during my care of the patient were reviewed by me and considered in my medical decision making (see chart for details).     Pregnancy test negative, UA with trace leuks, will send for urine culture, also checking vaginal swab to screen for any vaginal symptoms contributing to symptoms.  Discussed with patient symptoms may be related to vaginitis and treat UTI symptoms but given patient feels similar to past UTIs we will go ahead and empirically treat for this with Macrobid.  Will alter therapy as needed based off results.  Discussed strict return precautions. Patient verbalized understanding and is agreeable with plan.  Final Clinical Impressions(s) / UC Diagnoses   Final diagnoses:  Urinary frequency  Dysuria  Vaginal discharge     Discharge Instructions      Begin macrobid twice daily for 5 days Vaginal swab and urine culture pending Follow up if not improving     ED Prescriptions     Medication Sig Dispense Auth. Provider   nitrofurantoin, macrocrystal-monohydrate, (MACROBID) 100 MG capsule Take 1 capsule (100 mg total) by mouth 2 (two) times daily for 5 days. 10 capsule Kenasia Scheller, [place] C, PA-C      PDMP not reviewed this encounter.   Lew Dawes, [place] 08/12/20 [phone]

## 2020-08-13 LAB — CERVICOVAGINAL ANCILLARY ONLY
Bacterial Vaginitis (gardnerella): NEGATIVE
Candida Glabrata: NEGATIVE
Candida Vaginitis: NEGATIVE
Chlamydia: NEGATIVE
Comment: NEGATIVE
Comment: NEGATIVE
Comment: NEGATIVE
Comment: NEGATIVE
Comment: NEGATIVE
Comment: NORMAL
Neisseria Gonorrhea: NEGATIVE
Trichomonas: NEGATIVE

## 2020-08-14 LAB — URINE CULTURE: Culture: NO GROWTH

## 2020-08-31 ENCOUNTER — Ambulatory Visit: Payer: Medicaid Other

## 2020-09-03 ENCOUNTER — Ambulatory Visit
Admission: RE | Admit: 2020-09-03 | Discharge: 2020-09-03 | Disposition: A | Discharge status: Home / Self Care | Payer: Medicaid Other | Source: Ambulatory Visit | Attending: Emergency Medicine | Admitting: Emergency Medicine

## 2020-09-03 ENCOUNTER — Other Ambulatory Visit: Payer: Self-pay

## 2020-09-03 VITALS — BP 110/77 | HR 89 | Temp 98.3°F | Resp 20

## 2020-09-03 DIAGNOSIS — N760 Acute vaginitis: Secondary | ICD-10-CM | POA: Insufficient documentation

## 2020-09-03 LAB — POCT URINALYSIS DIP (MANUAL ENTRY)
Bilirubin, UA: NEGATIVE
Glucose, UA: NEGATIVE mg/dL
Ketones, POC UA: NEGATIVE mg/dL
Nitrite, UA: NEGATIVE
Protein Ur, POC: 30 mg/dL — AB
Spec Grav, UA: >=1.03 — AB (ref 1.010–1.025)
Urobilinogen, UA: 0.2 E.U./dL
pH, UA: 6 (ref 5.0–8.0)

## 2020-09-03 LAB — POCT URINE PREGNANCY: Preg Test, Ur: NEGATIVE

## 2020-09-03 MED ORDER — FLUCONAZOLE 150 MG PO TABS: 150.0000 mg | ORAL_TABLET | Freq: Every day | ORAL | 0 refills | Status: DC

## 2020-09-03 NOTE — ED Provider Notes (Signed)
[name]    CSN: [id number] Arrival date & time: 09/03/20  1401      History   Chief Complaint Chief Complaint  Patient presents with   Vaginal Discharge   Vaginal Itching   appointment @2pm     HPI Jill Neal is a 20 y.o. female presenting today for evaluation of vaginal discomfort.  Reports that over the past week she has had vaginal itching irritation and clumpy discharge.  Reports that symptoms began after completing antibiotic for UTI at her prior visit here.  Vaginal swab at the time was negative.  She reports some lower abdominal cramping.  Intermittent nausea.  Denies fevers.  Denies urinary symptoms today.  Reports irregular cycles, but last menstrual cycle was approximately 3 weeks ago.  HPI  Past Medical History:  Diagnosis Date   Anemia    Anemia in pregnancy, third trimester 03/06/2019   Hgb 9.8 at 28 week visit Rx for Ferrous Sulfate ordered   Chlamydia    Post-dates pregnancy 05/29/2019   Pregnancy affected by fetal growth restriction 05/29/2019   Short cervical length during pregnancy in second trimester 01/20/2019   2.7 cm on 01/20/19   Supervision of low-risk pregnancy 11/12/2018   BABYSCRIPTS PATIENT: [x ]Initial [x ]12 [x20 [x] 28 [ ] 32 [ ] 36 [ ] 38 [ ] 39 [ ] 40  Nursing Staff Provider Office Location  [facility] Dating   LMP and 6.1 week 11/14/2018 Language   English Anatomy   Normal  Flu Vaccine  12/04/2018 Genetic Screen   NIPS: low risk  AFP:   negative  TDaP vaccine   03/05/19 Hgb A1C or  GTT Early 5.2 Third trimester 79-108-93 Rhogam   N/A   LAB RESULTS  Feeding Plan  Bottle Bloo   UTI (lower urinary tract infection)     Patient Active Problem List   Diagnosis Date Noted   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 Authorizing Provider  fluconazole (DIFLUCAN) 150 MG tablet Take 1 tablet (150 mg total) by mouth daily. 09/03/20  Yes Larue Lightner C, PA-C  ibuprofen (ADVIL) 600 MG tablet Take 1 tablet (600 mg total) by mouth every 6 (six) hours as needed. 03/08/20   Lamptey, 02/03/2019, MD  ferrous sulfate 325 (65 FE) MG tablet Take 1 tablet (325 mg total) by mouth 3 (three) times daily with meals. Patient not taking: No sig reported 03/10/19 03/08/20  01/15/2019, DO  montelukast (SINGULAIR) 10 MG tablet Take 1 tablet (10 mg total) by mouth at bedtime. Patient not taking: Reported on 10/03/2018 11/22/17 10/03/18  Britta Mccreedy, MD    Family History Family History  Problem Relation Age of Onset   Hypertension Other    Healthy Mother    Healthy Father     Social History Social History   Tobacco Use  Smoking status: Never   Smokeless tobacco: Never  Vaping Use   Vaping Use: Never used  Substance Use Topics   Alcohol use: No    Comment: occasional   Drug use: Not Currently    Types: Marijuana    Comment: last used July     Allergies   Patient has no known allergies.   Review of Systems Review of Systems  Constitutional:  Negative for fever.  Respiratory:  Negative for shortness of breath.   Cardiovascular:  Negative for chest pain.  Gastrointestinal:  Positive for abdominal pain. Negative for diarrhea, nausea and vomiting.  Genitourinary:  Positive for vaginal discharge. Negative for dysuria, flank pain, genital sores, hematuria, menstrual problem, vaginal bleeding and vaginal pain.  Musculoskeletal:  Negative for back pain.  Skin:  Negative for rash.  Neurological:  Negative for dizziness, light-headedness and headaches.    Physical Exam Triage Vital Signs ED Triage Vitals  Enc Vitals Group     BP 09/03/20 1415 110/77      Pulse Rate 09/03/20 1415 89     Resp 09/03/20 1415 20     Temp 09/03/20 1415 98.3 F (36.8 C)     Temp Source 09/03/20 1415 Oral     SpO2 09/03/20 1415 97 %     Weight --      Height --      Head Circumference --      Peak Flow --      Pain Score 09/03/20 1419 3     Pain Loc --      Pain Edu? --      Excl. in GC? --    No data found.  Updated Vital Signs BP 110/77 (BP Location: Right Arm)   Pulse 89   Temp 98.3 F (36.8 C) (Oral)   Resp 20   SpO2 97%   Visual Acuity Right Eye Distance:   Left Eye Distance:   Bilateral Distance:    Right Eye Near:   Left Eye Near:    Bilateral Near:     Physical Exam Vitals and nursing note reviewed.  Constitutional:      Appearance: She is well-developed.     Comments: No acute distress  HENT:     Head: Normocephalic and atraumatic.     Nose: Nose normal.  Eyes:     Conjunctiva/sclera: Conjunctivae normal.  Cardiovascular:     Rate and Rhythm: Normal rate.  Pulmonary:     Effort: Pulmonary effort is normal. No respiratory distress.  Abdominal:     General: There is no distension.  Musculoskeletal:        General: Normal range of motion.     Cervical back: Neck supple.  Skin:    General: Skin is warm and dry.  Neurological:     Mental Status: She is alert and oriented to person, place, and time.     UC Treatments / Results  Labs (all labs ordered are listed, but only abnormal results are displayed) Labs Reviewed  POCT URINALYSIS DIP (MANUAL ENTRY) - Abnormal; Notable for the following components:      Result Value   Clarity, UA cloudy (*)    Spec Grav, UA >=1.030 (*)    Blood, UA trace-intact (*)    Protein Ur, POC =30 (*)    Leukocytes, UA Small (1+) (*)    All other components within normal limits  URINE CULTURE  POCT URINE PREGNANCY  CERVICOVAGINAL ANCILLARY ONLY    EKG   Radiology No  results found.  Procedures Procedures (including critical care time)  Medications Ordered in UC Medications  - No data to display  Initial Impression / Assessment and Plan / UC Course  I have reviewed the triage vital signs and the nursing notes.  Pertinent labs & imaging results that were available during my care of the patient were reviewed by me and considered in my medical decision making (see chart for details).     Vaginitis-suspect likely yeast, pregnancy test negative, will send for urine culture and vaginal swab to further evaluate cause of cramping/discharge, empirically treating for yeast today with Diflucan.  Alter therapy based off results.  Discussed strict return precautions. Patient verbalized understanding and is agreeable with plan.  Final Clinical Impressions(s) / UC Diagnoses   Final diagnoses:  Vaginitis and vulvovaginitis     Discharge Instructions      I am treating you for yeast infection-1 tab of Diflucan today, may repeat in 72 hours if still having symptoms Vaginal swab pending and urine culture pending to further screen for causes of symptoms We will call you if we need to change any medicines Follow-up if not improving or worsening     ED Prescriptions     Medication Sig Dispense Auth. Provider   fluconazole (DIFLUCAN) 150 MG tablet Take 1 tablet (150 mg total) by mouth daily. 2 tablet Dilpreet Faires, [place] C, PA-C      PDMP not reviewed this encounter.   Lew Dawes, PA-C 09/03/20 1450

## 2020-09-03 NOTE — ED Triage Notes (Signed)
Pt was last seen on 6/15 dx with a UTI. Pt reports after finishing her antibiotics, she began having vaginal discharge and itchiness. Confirms abdominal cramping.  Denies vaginal odor, hematuria, dysuria, urinary frequency and urgency. Pt is also requesting STD testing.

## 2020-09-03 NOTE — Discharge Instructions (Addendum)
I am treating you for yeast infection-1 tab of Diflucan today, may repeat in 72 hours if still having symptoms Vaginal swab pending and urine culture pending to further screen for causes of symptoms We will call you if we need to change any medicines Follow-up if not improving or worsening

## 2020-09-05 LAB — URINE CULTURE: Culture: 70000 — AB

## 2020-09-06 LAB — CERVICOVAGINAL ANCILLARY ONLY
Bacterial Vaginitis (gardnerella): NEGATIVE
Candida Glabrata: NEGATIVE
Candida Vaginitis: POSITIVE — AB
Chlamydia: NEGATIVE
Comment: NEGATIVE
Comment: NEGATIVE
Comment: NEGATIVE
Comment: NEGATIVE
Comment: NEGATIVE
Comment: NORMAL
Neisseria Gonorrhea: NEGATIVE
Trichomonas: NEGATIVE

## 2020-10-15 ENCOUNTER — Other Ambulatory Visit (HOSPITAL_COMMUNITY)
Admission: RE | Admit: 2020-10-15 | Discharge: 2020-10-15 | Disposition: A | Discharge status: Home / Self Care | Payer: Medicaid Other | Source: Ambulatory Visit | Attending: Family Medicine | Admitting: Family Medicine

## 2020-10-15 ENCOUNTER — Emergency Department (INDEPENDENT_AMBULATORY_CARE_PROVIDER_SITE_OTHER)
Admission: RE | Admit: 2020-10-15 | Discharge: 2020-10-15 | Disposition: A | Discharge status: Home / Self Care | Payer: Medicaid Other | Source: Ambulatory Visit | Attending: Family Medicine | Admitting: Family Medicine

## 2020-10-15 ENCOUNTER — Other Ambulatory Visit: Payer: Self-pay

## 2020-10-15 VITALS — BP 113/74 | HR 88 | Temp 98.5°F | Resp 18 | Ht 67.0 in | Wt 190.0 lb

## 2020-10-15 DIAGNOSIS — Z202 Contact with and (suspected) exposure to infections with a predominantly sexual mode of transmission: Secondary | ICD-10-CM

## 2020-10-15 DIAGNOSIS — R3589 Other polyuria: Secondary | ICD-10-CM

## 2020-10-15 DIAGNOSIS — Z113 Encounter for screening for infections with a predominantly sexual mode of transmission: Secondary | ICD-10-CM | POA: Diagnosis present

## 2020-10-15 DIAGNOSIS — N3090 Cystitis, unspecified without hematuria: Secondary | ICD-10-CM

## 2020-10-15 LAB — POCT URINALYSIS DIP (MANUAL ENTRY)
Bilirubin, UA: NEGATIVE
Blood, UA: NEGATIVE
Glucose, UA: NEGATIVE mg/dL
Nitrite, UA: NEGATIVE
Spec Grav, UA: 1.025 (ref 1.010–1.025)
Urobilinogen, UA: 1 E.U./dL
pH, UA: 7 (ref 5.0–8.0)

## 2020-10-15 LAB — POCT URINE PREGNANCY: Preg Test, Ur: NEGATIVE

## 2020-10-15 MED ORDER — CEPHALEXIN 500 MG PO CAPS: 500.0000 mg | ORAL_CAPSULE | Freq: Two times a day (BID) | ORAL | 0 refills | Status: DC

## 2020-10-15 MED ORDER — FLUCONAZOLE 150 MG PO TABS: 150.0000 mg | ORAL_TABLET | Freq: Once | ORAL | 0 refills | Status: AC

## 2020-10-15 NOTE — ED Triage Notes (Signed)
Polyuria, dysuria x 3 days, denies unprotected sex. Wants to do self swab.

## 2020-10-15 NOTE — ED Provider Notes (Signed)
[name]    CSN: [id number] Arrival date & time: 10/15/20  1645      History   Chief Complaint No chief complaint on file.   HPI Jill Neal is a 20 y.o. female.   Patient complains of dysuria/frequency for three days and is concerned that she may have a bladder infection.  She also wishes to be checked for STD because recently a condom broke during intercourse.  However she feels well otherwise, denying vaginal discharge, pelvic/abdominal pain, nausea/vomiting, and fevers, chills, and sweats.  Patient's last menstrual period was 10/01/2020 (exact date).   The history is provided by the patient.  Dysuria Pain quality:  Burning Pain severity:  Mild Onset quality:  Sudden Duration:  3 days Timing:  Constant Progression:  Worsening Chronicity:  New Recent urinary tract infections: no   Relieved by:  None tried Worsened by:  Nothing Ineffective treatments:  None tried Urinary symptoms: frequent urination and hesitancy   Urinary symptoms: no discolored urine, no foul-smelling urine, no hematuria and no bladder incontinence   Associated symptoms: no abdominal pain, no fever, no flank pain, no genital lesions, no nausea, no vaginal discharge and no vomiting   Risk factors: sexually active    Past Medical History:  Diagnosis Date   Anemia    Anemia in pregnancy, third trimester 03/06/2019   Hgb 9.8 at 28 week visit Rx for Ferrous Sulfate ordered   Chlamydia    Post-dates pregnancy 05/29/2019   Pregnancy affected by fetal growth restriction 05/29/2019   Short cervical length during pregnancy in second trimester 01/20/2019   2.7 cm on 01/20/19   Supervision of low-risk pregnancy 11/12/2018   BABYSCRIPTS PATIENT: [x ]Initial [x ]12 [x20 [x] 28 [ ] 32 [ ] 36 [ ] 38 [ ] 39 [ ] 40  Nursing Staff Provider Office Location  [facility] Dating   LMP and 6.1 week Language   English Anatomy   Normal  Flu Vaccine  12/04/2018 Genetic Screen   NIPS: low risk  AFP:   negative  TDaP  vaccine   03/05/19 Hgb A1C or  GTT Early 5.2 Third trimester 79-108-93 Rhogam   N/A   LAB RESULTS  Feeding Plan  Bottle Bloo   UTI (lower urinary tract infection)     Patient Active Problem List   Diagnosis Date Noted   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 Authorizing Provider  cephALEXin (KEFLEX) 500 MG capsule Take 1 capsule (500 mg total) by mouth 2 (two) times daily. 10/15/20  Yes 02/03/2019, MD  fluconazole (DIFLUCAN) 150 MG tablet Take 1 tablet (150 mg total) by mouth once for 1 dose. May repeat in 72 hours. 10/15/20 10/15/20 Yes 01/15/2019, MD  ibuprofen (ADVIL) 600 MG tablet Take 1 tablet (600 mg total) by mouth every 6 (six) hours as needed.  03/08/20   Lamptey, Britta Mccreedy, MD  ferrous sulfate 325 (65 FE) MG tablet Take 1 tablet (325 mg total) by mouth 3 (three) times daily with meals. Patient not taking: No sig reported 03/10/19 03/08/20  Levie Heritage, DO  montelukast (SINGULAIR) 10 MG tablet Take 1 tablet (10 mg total) by mouth at bedtime. Patient not taking: Reported on 10/03/2018 11/22/17 10/03/18  Elvina Sidle, MD    Family History Family History  Problem Relation Age of Onset   Hypertension Other    Healthy Mother    Healthy Father     Social History Social History   Tobacco Use   Smoking status: Never   Smokeless tobacco: Never  Vaping Use   Vaping Use: Never used  Substance Use Topics   Alcohol use: No    Comment: occasional   Drug use: Not Currently    Types: Marijuana    Comment: last used July     Allergies   Patient has no known allergies.   Review of  Systems Review of Systems  Constitutional:  Negative for chills, diaphoresis, fatigue and fever.  Gastrointestinal:  Negative for abdominal pain, nausea and vomiting.  Genitourinary:  Positive for dysuria, frequency and urgency. Negative for difficulty urinating, flank pain, genital sores, hematuria, pelvic pain and vaginal discharge.  Skin:  Negative for rash.  All other systems reviewed and are negative.   Physical Exam Triage Vital Signs ED Triage Vitals  Enc Vitals Group     BP 10/15/20 1739 113/74     Pulse Rate 10/15/20 1739 88     Resp 10/15/20 1739 18     Temp 10/15/20 1739 98.5 F (36.9 C)     Temp Source 10/15/20 1739 Oral     SpO2 10/15/20 1739 100 %     Weight 10/15/20 1740 190 lb (86.2 kg)     Height 10/15/20 1740 5\' 7"  (1.702 m)     Head Circumference --      Peak Flow --      Pain Score 10/15/20 1740 1     Pain Loc --      Pain Edu? --      Excl. in GC? --    No data found.  Updated Vital Signs BP 113/74 (BP Location: Left Arm)   Pulse 88   Temp 98.5 F (36.9 C) (Oral)   Resp 18   Ht 5\' 7"  (1.702 m)   Wt 86.2 kg   LMP 10/01/2020 (Exact Date)   SpO2 100%   BMI 29.76 kg/m   Visual Acuity Right Eye Distance:   Left Eye Distance:   Bilateral Distance:    Right Eye Near:   Left Eye Near:    Bilateral Near:     Physical Exam Nursing notes and Vital Signs reviewed. Appearance:  Patient appears stated age, and in no acute distress.    Eyes:  Pupils are equal, round, and reactive to light and accomodation.  Extraocular movement is intact.  Conjunctivae are not inflamed   Pharynx:  Normal; moist mucous membranes  Neck:  Supple.  No adenopathy Lungs:  Clear to auscultation.  Breath sounds are equal.  Moving air well. Heart:  Regular rate and rhythm without murmurs, rubs, or gallops.  Abdomen:  Nontender without masses or hepatosplenomegaly.  Bowel sounds are present.  No CVA or flank tenderness.  Extremities:  No edema.  Skin:  No rash present.     Pelvic exam: deferred  UC Treatments / Results  Labs (  all labs ordered are listed, but only abnormal results are displayed) Labs Reviewed  POCT URINALYSIS DIP (MANUAL ENTRY) - Abnormal; Notable for the following components:      Result Value   Color, UA other (*)    Clarity, UA cloudy (*)    Ketones, POC UA trace (5) (*)    Protein Ur, POC trace (*)    Leukocytes, UA Moderate (2+) (*)    All other components within normal limits  URINE CULTURE  POCT URINE PREGNANCY negative  CERVICOVAGINAL ANCILLARY ONLY    EKG   Radiology No results found.  Procedures Procedures (including critical care time)  Medications Ordered in UC Medications - No data to display  Initial Impression / Assessment and Plan / UC Course  I have reviewed the triage vital signs and the nursing notes.  Pertinent labs & imaging results that were available during my care of the patient were reviewed by me and considered in my medical decision making (see chart for details).    Urine culture pending.  Begin empiric Keflex.  Rx for Diflucan. Cervicovaginal ancillary pending (patient self performed vaginal specimen collection) Followup with Family Doctor if not improved in one week.  Final Clinical Impressions(s) / UC Diagnoses   Final diagnoses:  Polyuria  Cystitis  Possible exposure to STD     Discharge Instructions      Increase fluid intake.  May use non-prescription AZO for about two days, if desired, to decrease urinary discomfort.  Avoid sexual contact until all tests return negative.     ED Prescriptions     Medication Sig Dispense Auth. Provider   cephALEXin (KEFLEX) 500 MG capsule Take 1 capsule (500 mg total) by mouth 2 (two) times daily. 10 capsule Lattie Haw, MD   fluconazole (DIFLUCAN) 150 MG tablet Take 1 tablet (150 mg total) by mouth once for 1 dose. May repeat in 72 hours. 2 tablet Lattie Haw, MD         Lattie Haw, MD 10/16/20 [phone]

## 2020-10-15 NOTE — Discharge Instructions (Addendum)
Increase fluid intake.  May use non-prescription AZO for about two days, if desired, to decrease urinary discomfort.  Avoid sexual contact until all tests return negative.

## 2020-10-19 ENCOUNTER — Telehealth (HOSPITAL_COMMUNITY): Payer: Self-pay | Admitting: Emergency Medicine

## 2020-10-19 LAB — URINE CULTURE
MICRO NUMBER:: 12269633
SPECIMEN QUALITY:: ADEQUATE

## 2020-10-19 LAB — CERVICOVAGINAL ANCILLARY ONLY
Bacterial Vaginitis (gardnerella): NEGATIVE
Candida Glabrata: NEGATIVE
Candida Vaginitis: POSITIVE — AB
Chlamydia: NEGATIVE
Comment: NEGATIVE
Comment: NEGATIVE
Comment: NEGATIVE
Comment: NEGATIVE
Comment: NEGATIVE
Comment: NORMAL
Neisseria Gonorrhea: NEGATIVE
Trichomonas: NEGATIVE

## 2020-10-19 MED ORDER — FLUCONAZOLE 150 MG PO TABS: 150.0000 mg | ORAL_TABLET | Freq: Once | ORAL | 0 refills | Status: AC

## 2020-10-19 MED ORDER — NITROFURANTOIN MONOHYD MACRO 100 MG PO CAPS: 100.0000 mg | ORAL_CAPSULE | Freq: Two times a day (BID) | ORAL | 0 refills | Status: DC

## 2020-11-25 ENCOUNTER — Ambulatory Visit: Payer: Medicaid Other

## 2020-11-26 ENCOUNTER — Other Ambulatory Visit: Payer: Self-pay

## 2020-11-26 ENCOUNTER — Ambulatory Visit: Payer: Medicaid Other

## 2020-11-26 ENCOUNTER — Ambulatory Visit (HOSPITAL_COMMUNITY)
Admission: EM | Admit: 2020-11-26 | Discharge: 2020-11-26 | Disposition: A | Discharge status: Home / Self Care | Payer: Medicaid Other | Attending: Physician Assistant | Admitting: Physician Assistant

## 2020-11-26 ENCOUNTER — Encounter (HOSPITAL_COMMUNITY): Payer: Self-pay | Admitting: Emergency Medicine

## 2020-11-26 DIAGNOSIS — Z113 Encounter for screening for infections with a predominantly sexual mode of transmission: Secondary | ICD-10-CM | POA: Diagnosis not present

## 2020-11-26 DIAGNOSIS — N898 Other specified noninflammatory disorders of vagina: Secondary | ICD-10-CM | POA: Diagnosis not present

## 2020-11-26 LAB — HEPATITIS PANEL, ACUTE
HCV Ab: NONREACTIVE
Hep A IgM: NONREACTIVE
Hep B C IgM: NONREACTIVE
Hepatitis B Surface Ag: NONREACTIVE

## 2020-11-26 LAB — RPR: RPR Ser Ql: NONREACTIVE

## 2020-11-26 LAB — HIV ANTIBODY (ROUTINE TESTING W REFLEX): HIV Screen 4th Generation wRfx: NONREACTIVE

## 2020-11-26 NOTE — Discharge Instructions (Addendum)
Will notify of results once available. Recommend checking mychart. Follow up with any further concerns.

## 2020-11-26 NOTE — ED Provider Notes (Signed)
[facility]    CSN: [id number] Arrival date & time: 11/26/20  0820      History   Chief Complaint Chief Complaint  Patient presents with   Vaginitis    HPI Jill Neal is a 20 y.o. female.   Patient here today for evaluation of vaginal discharge, with odor that she noticed about 4 days ago.  She reports this is somewhat different than her typical yeast infection.  She would like screening for BV as well as other STDs.  She denies any known exposure to STD.  She denies risk of pregnancy, last menstrual period was last week.  She has not had any abdominal pain, back pain, nausea, vomiting.  She denies any fever or chills.    Past Medical History:  Diagnosis Date   Anemia    Anemia in pregnancy, third trimester 03/06/2019   Hgb 9.8 at 28 week visit Rx for Ferrous Sulfate ordered   Chlamydia    Post-dates pregnancy 05/29/2019   Pregnancy affected by fetal growth restriction 05/29/2019   Short cervical length during pregnancy in second trimester 01/20/2019   2.7 cm on 01/20/19   Supervision of low-risk pregnancy 11/12/2018   BABYSCRIPTS PATIENT: [x ]Initial [x ]12 [x20 [x] 28 [ ] 32 [ ] 36 [ ] 38 [ ] 39 [ ] 40  Nursing Staff Provider Office Location  [facility] Dating   LMP and 6.1 week Language   English Anatomy   Normal  Flu Vaccine  12/04/2018 Genetic Screen   NIPS: low risk  AFP:   negative  TDaP vaccine   03/05/19 Hgb A1C or  GTT Early 5.2 Third trimester 79-108-93 Rhogam   N/A   LAB RESULTS  Feeding Plan  Bottle Bloo   UTI (lower urinary tract infection)     Patient Active Problem List   Diagnosis Date Noted   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 Authorizing Provider  cephALEXin (KEFLEX) 500 MG capsule Take 1 capsule (500 mg total) by mouth 2 (two) times daily. 10/15/20   02/03/2019, MD  ibuprofen (ADVIL) 600 MG tablet Take 1 tablet (600 mg total) by mouth every 6 (six) hours as needed. 03/08/20   Lamptey, 01-16-2004, MD  nitrofurantoin, macrocrystal-monohydrate, (MACROBID) 100 MG capsule Take 1 capsule (100 mg total) by mouth 2 (two) times daily. 10/19/20   Lamptey12/07/2018, MD  ferrous sulfate 325 (65 FE) MG tablet Take 1 tablet (325 mg total) by mouth 3 (three) times daily with meals. Patient not taking: No sig reported 03/10/19 03/08/20  05/06/20, DO  montelukast (SINGULAIR) 10 MG tablet Take 1 tablet (10 mg total) by mouth at bedtime. Patient not taking: Reported on 10/03/2018 11/22/17 10/03/18  05/08/19, MD    Family History Family History  Problem Relation Age of Onset   Hypertension  Other    Healthy Mother    Healthy Father     Social History Social History   Tobacco Use   Smoking status: Never   Smokeless tobacco: Never  Vaping Use   Vaping Use: Never used  Substance Use Topics   Alcohol use: No    Comment: occasional   Drug use: Not Currently    Types: Marijuana    Comment: last used July     Allergies   Patient has no known allergies.   Review of Systems Review of Systems  Constitutional:  Negative for chills and fever.  Eyes:  Negative for discharge and redness.  Respiratory:  Negative for shortness of breath.   Gastrointestinal:  Negative for abdominal pain, nausea and vomiting.  Genitourinary:  Positive for vaginal discharge. Negative for dysuria and frequency.  Musculoskeletal:  Negative for back pain.    Physical Exam Triage Vital Signs ED Triage Vitals  Enc Vitals Group     BP 11/26/20 0841 102/72     Pulse Rate 11/26/20 0841 87     Resp 11/26/20 0841 16     Temp 11/26/20 0841 98.6 F (37 C)      Temp Source 11/26/20 0841 Oral     SpO2 11/26/20 0841 98 %     Weight --      Height --      Head Circumference --      Peak Flow --      Pain Score 11/26/20 0840 0     Pain Loc --      Pain Edu? --      Excl. in GC? --    No data found.  Updated Vital Signs BP 102/72 (BP Location: Left Arm)   Pulse 87   Temp 98.6 F (37 C) (Oral)   Resp 16   LMP 11/19/2020   SpO2 98%      Physical Exam Vitals and nursing note reviewed.  Constitutional:      General: She is not in acute distress.    Appearance: Normal appearance. She is not ill-appearing.  HENT:     Head: Normocephalic and atraumatic.  Eyes:     Conjunctiva/sclera: Conjunctivae normal.  Cardiovascular:     Rate and Rhythm: Normal rate.  Pulmonary:     Effort: Pulmonary effort is normal.  Neurological:     Mental Status: She is alert.  Psychiatric:        Mood and Affect: Mood normal.        Behavior: Behavior normal.        Thought Content: Thought content normal.     UC Treatments / Results  Labs (all labs ordered are listed, but only abnormal results are displayed) Labs Reviewed  HIV ANTIBODY (ROUTINE TESTING W REFLEX)  HEPATITIS PANEL, ACUTE  RPR  CERVICOVAGINAL ANCILLARY ONLY    EKG   Radiology No results found.  Procedures Procedures (including critical care time)  Medications Ordered in UC Medications - No data to display  Initial Impression / Assessment and Plan / UC Course  I have reviewed the triage vital signs and the nursing notes.  Pertinent labs & imaging results that were available during my care of the patient were reviewed by me and considered in my medical decision making (see chart for details).  STD screening ordered as requested.  Will await results for further recommendation.  Encouraged sooner follow-up with any further concerns.  Final Clinical Impressions(s) / UC Diagnoses   Final diagnoses:  Screening for  STD (sexually transmitted disease)  Vaginal discharge      Discharge Instructions      Will notify of results once available. Recommend checking mychart. Follow up with any further concerns.      ED Prescriptions   None    PDMP not reviewed this encounter.   Tomi Bamberger, PA-C 11/26/20 [phone]

## 2020-11-26 NOTE — ED Triage Notes (Signed)
Pt presents with vaginal itching and odor xs 3-4 days.

## 2020-11-29 LAB — CERVICOVAGINAL ANCILLARY ONLY
Bacterial Vaginitis (gardnerella): POSITIVE — AB
Candida Glabrata: NEGATIVE
Candida Vaginitis: NEGATIVE
Chlamydia: NEGATIVE
Comment: NEGATIVE
Comment: NEGATIVE
Comment: NEGATIVE
Comment: NEGATIVE
Comment: NEGATIVE
Comment: NORMAL
Neisseria Gonorrhea: NEGATIVE
Trichomonas: NEGATIVE

## 2020-11-30 ENCOUNTER — Telehealth (HOSPITAL_COMMUNITY): Payer: Self-pay | Admitting: Emergency Medicine

## 2020-11-30 MED ORDER — METRONIDAZOLE 500 MG PO TABS: 500.0000 mg | ORAL_TABLET | Freq: Two times a day (BID) | ORAL | 0 refills | Status: DC

## 2020-12-18 ENCOUNTER — Ambulatory Visit (HOSPITAL_COMMUNITY)
Admission: RE | Admit: 2020-12-18 | Discharge: 2020-12-18 | Disposition: A | Discharge status: Home / Self Care | Payer: Medicaid Other | Source: Ambulatory Visit | Attending: Student | Admitting: Student

## 2020-12-18 ENCOUNTER — Other Ambulatory Visit: Payer: Self-pay

## 2020-12-18 ENCOUNTER — Encounter (HOSPITAL_COMMUNITY): Payer: Self-pay

## 2020-12-18 VITALS — BP 118/78 | HR 103 | Temp 98.2°F | Resp 18

## 2020-12-18 DIAGNOSIS — N760 Acute vaginitis: Secondary | ICD-10-CM | POA: Diagnosis not present

## 2020-12-18 DIAGNOSIS — Z3202 Encounter for pregnancy test, result negative: Secondary | ICD-10-CM

## 2020-12-18 LAB — POCT URINALYSIS DIPSTICK, ED / UC
Glucose, UA: NEGATIVE mg/dL
Hgb urine dipstick: NEGATIVE
Ketones, ur: NEGATIVE mg/dL
Nitrite: NEGATIVE
Protein, ur: NEGATIVE mg/dL
Specific Gravity, Urine: >=1.03 (ref 1.005–1.030)
Urobilinogen, UA: 0.2 mg/dL (ref 0.0–1.0)
pH: 5.5 (ref 5.0–8.0)

## 2020-12-18 LAB — POC URINE PREG, ED: Preg Test, Ur: NEGATIVE

## 2020-12-18 MED ORDER — FLUCONAZOLE 150 MG PO TABS: 150.0000 mg | ORAL_TABLET | Freq: Every day | ORAL | 0 refills | Status: DC

## 2020-12-18 NOTE — ED Triage Notes (Signed)
Pt c/o vaginal discharge and irritation onset approx 4 days ago; believes she may have a yeast infection after being treated for BV.  Denies any dysuria.

## 2020-12-18 NOTE — ED Provider Notes (Signed)
[facility]    CSN: [id number] Arrival date & time: 12/18/20  1650      History   Chief Complaint Chief Complaint  Patient presents with   Appointment    1700   Vaginal Discharge    HPI Jill Neal is a 20 y.o. female presenting with vaginal discharge following recent antibiotic use.  Medical history pregnancy, chlamydia, UTI.  Describes 4 days of white clumpy vaginal discharge and external vaginal irritation and itching.  Completed antibiotics (Flagyl) for BV about 2 weeks ago.  Denies new partners since last STI screening.  Denies urinary symptoms including dysuria, hematuria.  Denies abdominal pain or flank pain.  HPI  Past Medical History:  Diagnosis Date   Anemia    Anemia in pregnancy, third trimester 03/06/2019   Hgb 9.8 at 28 week visit Rx for Ferrous Sulfate ordered   Chlamydia    Post-dates pregnancy 05/29/2019   Pregnancy affected by fetal growth restriction 05/29/2019   Short cervical length during pregnancy in second trimester 01/20/2019   2.7 cm on 01/20/19   Supervision of low-risk pregnancy 11/12/2018   BABYSCRIPTS PATIENT: [x ]Initial [x ]12 [x20 [x] 28 [ ] 32 [ ] 36 [ ] 38 [ ] 39 [ ] 40  Nursing Staff Provider Office Location  [facility] Dating   LMP and 6.1 week Language   English Anatomy   Normal  Flu Vaccine  12/04/2018 Genetic Screen   NIPS: low risk  AFP:   negative  TDaP vaccine   03/05/19 Hgb A1C or  GTT Early 5.2 Third trimester 79-108-93 Rhogam   N/A   LAB RESULTS  Feeding Plan  Bottle Bloo   UTI (lower urinary tract infection)     Patient Active Problem List   Diagnosis Date Noted   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 Authorizing Provider  fluconazole (DIFLUCAN) 150 MG tablet Take 1 tablet (150 mg total) by mouth daily. -For your yeast infection, start the Diflucan (fluconazole)- Take one pill today (day 1). If you're still having symptoms in 3 days, take the second pill. 12/18/20  Yes 02/03/2019, PA-C  Probiotic Product (UP4 PROBIOTICS PO) Take by mouth.   Yes [provider]  ibuprofen (ADVIL) 600 MG tablet Take 1 tablet (600 mg total) by mouth every 6 (six) hours as needed. 03/08/20   Lamptey, 01-16-2004, MD  metroNIDAZOLE (FLAGYL) 500 MG tablet Take 1 tablet (500 mg total) by mouth 2 (two) times daily. 11/30/20   Lamptey12/07/2018, MD  ferrous sulfate 325 (65 FE) MG tablet Take 1 tablet (325 mg total) by mouth 3 (three) times daily with meals. Patient not taking: No sig reported 03/10/19 03/08/20  05/06/20, DO  montelukast (SINGULAIR) 10 MG tablet Take 1 tablet (10 mg total) by mouth at bedtime. Patient not taking: Reported  on 10/03/2018 11/22/17 10/03/18  Elvina Sidle, MD    Family History Family History  Problem Relation Age of Onset   Hypertension Other    Healthy Mother    Healthy Father     Social History Social History   Tobacco Use   Smoking status: Never   Smokeless tobacco: Never  Vaping Use   Vaping Use: Former  Substance Use Topics   Alcohol use: Not Currently   Drug use: Not Currently    Types: Marijuana    Comment: last used July     Allergies   Patient has no known allergies.   Review of Systems Review of Systems  Constitutional:  Negative for chills and fever.  HENT:  Negative for sore throat.   Eyes:  Negative for pain and redness.  Respiratory:  Negative for shortness of breath.   Cardiovascular:  Negative for chest pain.  Gastrointestinal:  Negative for abdominal pain, diarrhea, nausea and vomiting.  Genitourinary:  Positive for vaginal discharge. Negative for  decreased urine volume, difficulty urinating, dysuria, flank pain, frequency, genital sores, hematuria, urgency, vaginal bleeding and vaginal pain.  Musculoskeletal:  Negative for back pain.  Skin:  Negative for rash.  All other systems reviewed and are negative.   Physical Exam Triage Vital Signs ED Triage Vitals  Enc Vitals Group     BP 12/18/20 1716 118/78     Pulse Rate 12/18/20 1716 (!) 103     Resp 12/18/20 1716 18     Temp 12/18/20 1716 98.2 F (36.8 C)     Temp Source 12/18/20 1716 Oral     SpO2 12/18/20 1716 95 %     Weight --      Height --      Head Circumference --      Peak Flow --      Pain Score 12/18/20 1718 0     Pain Loc --      Pain Edu? --      Excl. in GC? --    No data found.  Updated Vital Signs BP 118/78   Pulse (!) 103   Temp 98.2 F (36.8 C) (Oral)   Resp 18   LMP 11/19/2020 (Exact Date)   SpO2 95%   Breastfeeding No   Visual Acuity Right Eye Distance:   Left Eye Distance:   Bilateral Distance:    Right Eye Near:   Left Eye Near:    Bilateral Near:     Physical Exam Vitals reviewed.  Constitutional:      General: She is not in acute distress.    Appearance: Normal appearance. She is not ill-appearing.  HENT:     Head: Normocephalic and atraumatic.     Mouth/Throat:     Mouth: Mucous membranes are moist.     Comments: Moist mucous membranes Eyes:     Extraocular Movements: Extraocular movements intact.     Pupils: Pupils are equal, round, and reactive to light.  Cardiovascular:     Rate and Rhythm: Normal rate and regular rhythm.     Heart sounds: Normal heart sounds.  Pulmonary:     Effort: Pulmonary effort is normal.     Breath sounds: Normal breath sounds. No wheezing, rhonchi or rales.  Abdominal:     General: Bowel sounds are normal. There is no distension.     Palpations: Abdomen is soft. There is no mass.     Tenderness: There is no abdominal tenderness. There is no right CVA tenderness, left CVA  tenderness, guarding  or rebound.  Genitourinary:    Comments: deferred Skin:    General: Skin is warm.     Capillary Refill: Capillary refill takes less than 2 seconds.     Comments: Good skin turgor  Neurological:     General: No focal deficit present.     Mental Status: She is alert and oriented to person, place, and time.  Psychiatric:        Mood and Affect: Mood normal.        Behavior: Behavior normal.     UC Treatments / Results  Labs (all labs ordered are listed, but only abnormal results are displayed) Labs Reviewed  POCT URINALYSIS DIPSTICK, ED / UC - Abnormal; Notable for the following components:      Result Value   Bilirubin Urine SMALL (*)    Leukocytes,Ua TRACE (*)    All other components within normal limits  POC URINE PREG, ED  CERVICOVAGINAL ANCILLARY ONLY    EKG   Radiology No results found.  Procedures Procedures (including critical care time)  Medications Ordered in UC Medications - No data to display  Initial Impression / Assessment and Plan / UC Course  I have reviewed the triage vital signs and the nursing notes.  Pertinent labs & imaging results that were available during my care of the patient were reviewed by me and considered in my medical decision making (see chart for details).     This patient is a very pleasant 20 y.o. year old female presenting with suspected vaginal candidiasis following abx for BV. Borderline tachycardic but afebrile. No abd pain or CVAT.   Denies new partners. Will send self-swab for G/C, trich, yeast, BV testing. Declines HIV, RPR. Safe sex precautions.  UA with trace leuk, did not send culture. U-preg negative.  Treated for BV 9/30 with flagyl. Suspect vaginal candidiasis given recent abx. Diflucan sent.  Coding Level 4 for acute illness with systemic symptoms (tachycardia), and prescription drug management  Final Clinical Impressions(s) / UC Diagnoses   Final diagnoses:  Vaginitis and vulvovaginitis  Negative pregnancy  test     Discharge Instructions      -For your yeast infection, start the Diflucan (fluconazole)- Take one pill today (day 1). If you're still having symptoms in 3 days, take the second pill.       ED Prescriptions     Medication Sig Dispense Auth. Provider   fluconazole (DIFLUCAN) 150 MG tablet Take 1 tablet (150 mg total) by mouth daily. -For your yeast infection, start the Diflucan (fluconazole)- Take one pill today (day 1). If you're still having symptoms in 3 days, take the second pill. 2 tablet Rhys Martini, PA-C      PDMP not reviewed this encounter.   Rhys Martini, PA-C 12/18/20 1742

## 2020-12-18 NOTE — Discharge Instructions (Addendum)
-  For your yeast infection, start the Diflucan (fluconazole)- Take one pill today (day 1). If you're still having symptoms in 3 days, take the second pill.   

## 2020-12-20 LAB — CERVICOVAGINAL ANCILLARY ONLY
Bacterial Vaginitis (gardnerella): NEGATIVE
Candida Glabrata: NEGATIVE
Candida Vaginitis: NEGATIVE
Chlamydia: NEGATIVE
Comment: NEGATIVE
Comment: NEGATIVE
Comment: NEGATIVE
Comment: NEGATIVE
Comment: NEGATIVE
Comment: NORMAL
Neisseria Gonorrhea: NEGATIVE
Trichomonas: NEGATIVE

## 2021-01-22 ENCOUNTER — Ambulatory Visit (HOSPITAL_COMMUNITY): Payer: Medicaid Other

## 2021-01-22 ENCOUNTER — Telehealth (HOSPITAL_COMMUNITY): Payer: Self-pay | Admitting: *Deleted

## 2021-01-27 ENCOUNTER — Ambulatory Visit: Payer: Medicaid Other

## 2021-01-31 ENCOUNTER — Ambulatory Visit: Payer: Medicaid Other

## 2021-03-10 ENCOUNTER — Other Ambulatory Visit: Payer: Self-pay

## 2021-03-10 ENCOUNTER — Encounter: Payer: Self-pay | Admitting: Emergency Medicine

## 2021-03-10 ENCOUNTER — Ambulatory Visit
Admission: EM | Admit: 2021-03-10 | Discharge: 2021-03-10 | Disposition: A | Discharge status: Home / Self Care | Payer: Medicaid Other | Attending: Physician Assistant | Admitting: Physician Assistant

## 2021-03-10 DIAGNOSIS — N760 Acute vaginitis: Secondary | ICD-10-CM | POA: Diagnosis present

## 2021-03-10 LAB — POCT URINE PREGNANCY: Preg Test, Ur: NEGATIVE

## 2021-03-10 NOTE — ED Triage Notes (Signed)
Requesting pregnancy test and test for yeast infection. Reports a vaginal and lower abdominal discomfort. Denies any new discharge

## 2021-03-10 NOTE — ED Provider Notes (Signed)
[name]    CSN: [id number] Arrival date & time: 03/10/21  1752      History   Chief Complaint Chief Complaint  Patient presents with   Vaginitis    HPI Jill Neal is a 21 y.o. female.   Patient here today for evaluation of vaginal discomfort as well as lower abdominal discomfort that feels very similar to what she is experienced in the past pregnancy.  She does state that she is 16 days late for her period.  She has not had any new vaginal discharge.  She denies any fever or other symptoms.  The history is provided by the patient.   Past Medical History:  Diagnosis Date   Anemia    Anemia in pregnancy, third trimester 03/06/2019   Hgb 9.8 at 28 week visit Rx for Ferrous Sulfate ordered   Chlamydia    Post-dates pregnancy 05/29/2019   Pregnancy affected by fetal growth restriction 05/29/2019   Short cervical length during pregnancy in second trimester 01/20/2019   2.7 cm on 01/20/19   Supervision of low-risk pregnancy 11/12/2018   BABYSCRIPTS PATIENT: [x ]Initial [x ]12 [x20 [x] 28 [ ] 32 [ ] 36 [ ] 38 [ ] 39 [ ] 40  Nursing Staff Provider Office Location  [facility] Dating   LMP and 6.1 week Language   English Anatomy   Normal  Flu Vaccine  12/04/2018 Genetic Screen   NIPS: low risk  AFP:   negative  TDaP vaccine   03/05/19 Hgb A1C or  GTT Early 5.2 Third trimester 79-108-93 Rhogam   N/A   LAB RESULTS  Feeding Plan  Bottle Bloo   UTI (lower urinary tract infection)     Patient Active Problem List   Diagnosis Date Noted   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 Authorizing  Provider  fluconazole (DIFLUCAN) 150 MG tablet Take 1 tablet (150 mg total) by mouth daily. -For your yeast infection, start the Diflucan (fluconazole)- Take one pill today (day 1). If you're still having symptoms in 3 days, take the second pill. 12/18/20   02/03/2019, PA-C  ibuprofen (ADVIL) 600 MG tablet Take 1 tablet (600 mg total) by mouth every 6 (six) hours as needed. 03/08/20   Lamptey, 01-16[birth year]2004, MD  metroNIDAZOLE (FLAGYL) 500 MG tablet Take 1 tablet (500 mg total) by mouth 2 (two) times daily. 11/30/20   Lamptey12/07/2018, MD  Probiotic Product (UP4 PROBIOTICS PO) Take by mouth.    [provider]  ferrous sulfate 325 (65 FE) MG tablet Take 1 tablet (325 mg total) by mouth 3 (three) times daily with meals. Patient not taking: No sig reported 03/10/19 03/08/20  05/06/20, DO  montelukast (SINGULAIR) 10 MG tablet Take 1 tablet (10 mg total) by mouth at bedtime. Patient not taking: Reported on 10/03/2018 11/22/17  10/03/18  Elvina SidleLauenstein, Kurt, MD    Family History Family History  Problem Relation Age of Onset   Hypertension Other    Healthy Mother    Healthy Father     Social History Social History   Tobacco Use   Smoking status: Never   Smokeless tobacco: Never  Vaping Use   Vaping Use: Former  Substance Use Topics   Alcohol use: Not Currently   Drug use: Not Currently    Types: Marijuana    Comment: last used July     Allergies   Patient has no known allergies.   Review of Systems Review of Systems  Constitutional:  Negative for chills and fever.  Eyes:  Negative for discharge and redness.  Respiratory:  Negative for shortness of breath.   Gastrointestinal:  Positive for abdominal pain. Negative for nausea and vomiting.  Genitourinary:  Positive for menstrual problem. Negative for vaginal bleeding and vaginal discharge.    Physical Exam Triage Vital Signs ED Triage Vitals  Enc Vitals Group     BP      Pulse      Resp      Temp      Temp src       SpO2      Weight      Height      Head Circumference      Peak Flow      Pain Score      Pain Loc      Pain Edu?      Excl. in GC?    No data found.  Updated Vital Signs BP 117/77 (BP Location: Left Arm)    Pulse 84    Temp 97.8 F (36.6 C) (Oral)    Resp 16    SpO2 96%       Physical Exam Vitals and nursing note reviewed.  Constitutional:      General: She is not in acute distress.    Appearance: Normal appearance. She is not ill-appearing.  HENT:     Head: Normocephalic and atraumatic.  Eyes:     Conjunctiva/sclera: Conjunctivae normal.  Cardiovascular:     Rate and Rhythm: Normal rate.  Pulmonary:     Effort: Pulmonary effort is normal.  Neurological:     Mental Status: She is alert.  Psychiatric:        Mood and Affect: Mood normal.        Behavior: Behavior normal.        Thought Content: Thought content normal.     UC Treatments / Results  Labs (all labs ordered are listed, but only abnormal results are displayed) Labs Reviewed  POCT URINE PREGNANCY  CERVICOVAGINAL ANCILLARY ONLY    EKG   Radiology No results found.  Procedures Procedures (including critical care time)  Medications Ordered in UC Medications - No data to display  Initial Impression / Assessment and Plan / UC Course  I have reviewed the triage vital signs and the nursing notes.  Pertinent labs & imaging results that were available during my care of the patient were reviewed by me and considered in my medical decision making (see chart for details).  Pregnancy test negative in office.  Will order STD screening as well as BV and yeast screening.  Will await results further recommendation.  Recommended she retake pregnancy test at home if she does not start her menstrual period in the next week.  Final Clinical Impressions(s) / UC Diagnoses   Final diagnoses:  Acute vaginitis   Discharge Instructions   None    ED Prescriptions   None    PDMP not reviewed this  encounter.   Tomi Bamberger, PA-C 03/10/21 1834

## 2021-03-11 LAB — CERVICOVAGINAL ANCILLARY ONLY
Candida Glabrata: NEGATIVE
Candida Vaginitis: NEGATIVE
Chlamydia: NEGATIVE
Comment: NEGATIVE
Comment: NEGATIVE
Comment: NEGATIVE
Comment: NEGATIVE
Comment: NORMAL
Neisseria Gonorrhea: NEGATIVE
Trichomonas: NEGATIVE

## 2021-03-21 ENCOUNTER — Ambulatory Visit
Admission: RE | Admit: 2021-03-21 | Discharge: 2021-03-21 | Disposition: A | Discharge status: Home / Self Care | Payer: Medicaid Other | Source: Ambulatory Visit | Attending: Internal Medicine | Admitting: Internal Medicine

## 2021-03-21 ENCOUNTER — Other Ambulatory Visit: Payer: Self-pay

## 2021-03-21 VITALS — BP 112/71 | HR 85 | Temp 98.2°F | Resp 18

## 2021-03-21 DIAGNOSIS — N926 Irregular menstruation, unspecified: Secondary | ICD-10-CM | POA: Insufficient documentation

## 2021-03-21 DIAGNOSIS — R350 Frequency of micturition: Secondary | ICD-10-CM | POA: Diagnosis present

## 2021-03-21 DIAGNOSIS — N898 Other specified noninflammatory disorders of vagina: Secondary | ICD-10-CM | POA: Insufficient documentation

## 2021-03-21 DIAGNOSIS — R300 Dysuria: Secondary | ICD-10-CM | POA: Insufficient documentation

## 2021-03-21 LAB — POCT URINALYSIS DIP (MANUAL ENTRY)
Bilirubin, UA: NEGATIVE
Blood, UA: NEGATIVE
Glucose, UA: NEGATIVE mg/dL
Ketones, POC UA: NEGATIVE mg/dL
Nitrite, UA: NEGATIVE
Protein Ur, POC: 30 mg/dL — AB
Spec Grav, UA: 1.025 (ref 1.010–1.025)
Urobilinogen, UA: 1 E.U./dL
pH, UA: 6 (ref 5.0–8.0)

## 2021-03-21 LAB — POCT URINE PREGNANCY: Preg Test, Ur: NEGATIVE

## 2021-03-21 MED ORDER — CEPHALEXIN 500 MG PO CAPS: 500.0000 mg | ORAL_CAPSULE | Freq: Four times a day (QID) | ORAL | 0 refills | Status: AC

## 2021-03-21 MED ORDER — FLUCONAZOLE 150 MG PO TABS: 150.0000 mg | ORAL_TABLET | ORAL | 0 refills | Status: DC

## 2021-03-21 NOTE — ED Provider Notes (Signed)
[name]    CSN: [id number] Arrival date & time: 03/21/21  1402      History   Chief Complaint Chief Complaint  Patient presents with   Appointment    HPI Jill Neal is a 21 y.o. female.   Patient presents with persistent vaginal discomfort.  Patient was seen on 03/10/2021 for vaginal discomfort and vaginal discharge.  Cervicovaginal swab was completed that was negative.  She also had a negative urine pregnancy test as she had been 16 days since her last period.  That was also negative.  She reports that symptoms have not improved and have been persistent.  She does have a white, thick vaginal discharge that is present along with urinary burning and urinary frequency. Vaginal discomfort occurring to labia of vagina. Denies any vaginal lesions. Denies hematuria, irregular vaginal bleeding, back pain, abdominal pain, fever.  Denies any known exposure to STD but has had unprotected sexual intercourse recently.  Patient is not sure of the exact date of her last menstrual cycle but reports that it was in November 2022.  She reports that she typically has monthly menstrual cycles and does not miss a cycle.    Past Medical History:  Diagnosis Date   Anemia    Anemia in pregnancy, third trimester 03/06/2019   Hgb 9.8 at 28 week visit Rx for Ferrous Sulfate ordered   Chlamydia    Post-dates pregnancy 05/29/2019   Pregnancy affected by fetal growth restriction 05/29/2019   Short cervical length during pregnancy in second trimester 01/20/2019   2.7 cm on 01/20/19   Supervision of low-risk pregnancy 11/12/2018   BABYSCRIPTS PATIENT: [x ]Initial [x ]12 [x20 [x] 28 [ ] 32 [ ] 36 [ ] 38 [ ] 39 [ ] 40  Nursing Staff Provider Office Location  [facility] Dating   LMP and 6.1 week [place] Language   English Anatomy [place]   Normal  Flu Vaccine  12/04/2018 Genetic Screen   NIPS: low risk  AFP:   negative  TDaP vaccine   03/05/19 Hgb A1C or  GTT Early 5.2 Third trimester 79-108-93 Rhogam   N/A   LAB RESULTS   Feeding Plan  Bottle Bloo   UTI (lower urinary tract infection)     Patient Active Problem List   Diagnosis Date Noted   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 Authorizing Provider  cephALEXin (KEFLEX) 500 MG capsule Take 1 capsule (500 mg total) by mouth 4 (four) times daily for 5 days. 03/21/21 03/26/21 Yes , Michele Rockers, FNP  fluconazole (DIFLUCAN) 150 MG tablet Take 1 tablet (150 mg total) by mouth every 3 (three) days. May take second pill in 3 days if no resolution of symptoms with first pill. 03/21/21  Yes , Hildred Alamin E, FNP  ibuprofen (ADVIL) 600 MG tablet Take 1 tablet (600 mg total) by mouth every  6 (six) hours as needed. 03/08/20   Lamptey, Myrene Galas, MD  metroNIDAZOLE (FLAGYL) 500 MG tablet Take 1 tablet (500 mg total) by mouth 2 (two) times daily. 11/30/20   LampteyMyrene Galas, MD  Probiotic Product (UP4 PROBIOTICS PO) Take by mouth.    [provider]  ferrous sulfate 325 (65 FE) MG tablet Take 1 tablet (325 mg total) by mouth 3 (three) times daily with meals. Patient not taking: No sig reported 03/10/19 03/08/20  Truett Mainland, DO  montelukast (SINGULAIR) 10 MG tablet Take 1 tablet (10 mg total) by mouth at bedtime. Patient not taking: Reported on 10/03/2018 11/22/17 10/03/18  Robyn Haber, MD    Family History Family History  Problem Relation Age of Onset   Hypertension Other    Healthy Mother    Healthy Father     Social History Social History   Tobacco Use   Smoking status: Never   Smokeless tobacco: Never  Vaping Use   Vaping Use: Former  Substance Use Topics    Alcohol use: Not Currently   Drug use: Not Currently    Types: Marijuana    Comment: last used July     Allergies   Patient has no known allergies.   Review of Systems Review of Systems Per HPI  Physical Exam Triage Vital Signs ED Triage Vitals  Enc Vitals Group     BP 03/21/21 1441 112/71     Pulse Rate 03/21/21 1441 85     Resp 03/21/21 1441 18     Temp 03/21/21 1441 98.2 F (36.8 C)     Temp Source 03/21/21 1441 Oral     SpO2 03/21/21 1441 98 %     Weight --      Height --      Head Circumference --      Peak Flow --      Pain Score 03/21/21 1442 7     Pain Loc --      Pain Edu? --      Excl. in [place]? --    No data found.  Updated Vital Signs BP 112/71 (BP Location: Left Arm)    Pulse 85    Temp 98.2 F (36.8 C) (Oral)    Resp 18    SpO2 98%   Visual Acuity Right Eye Distance:   Left Eye Distance:   Bilateral Distance:    Right Eye Near:   Left Eye Near:    Bilateral Near:     Physical Exam Exam conducted with a chaperone present.  Constitutional:      General: She is not in acute distress.    Appearance: Normal appearance. She is not toxic-appearing or diaphoretic.  HENT:     Head: Normocephalic and atraumatic.  Eyes:     Extraocular Movements: Extraocular movements intact.     Conjunctiva/sclera: Conjunctivae normal.  Pulmonary:     Effort: Pulmonary effort is normal.  Genitourinary:    Pubic Area: No rash.      Labia:        Right: No lesion or injury.        Left: Tenderness present. No lesion or injury.      Comments: Erythema and tenderness to palpation located to left labia and some mild erythema noted to right labia.  No lesions present.  There is a white, thick discharge noted to vaginal canal as well as inner labia. Neurological:     General: No focal deficit present.  Mental Status: She is alert and oriented to person, place, and time. Mental status is at baseline.  Psychiatric:        Mood and Affect: Mood normal.         Behavior: Behavior normal.        Thought Content: Thought content normal.        Judgment: Judgment normal.     UC Treatments / Results  Labs (all labs ordered are listed, but only abnormal results are displayed) Labs Reviewed  POCT URINALYSIS DIP (MANUAL ENTRY) - Abnormal; Notable for the following components:      Result Value   Protein Ur, POC =30 (*)    Leukocytes, UA Small (1+) (*)    All other components within normal limits  URINE CULTURE  BETA HCG QUANT (REF LAB)  POCT URINE PREGNANCY  CERVICOVAGINAL ANCILLARY ONLY    EKG   Radiology No results found.  Procedures Procedures (including critical care time)  Medications Ordered in UC Medications - No data to display  Initial Impression / Assessment and Plan / UC Course  I have reviewed the triage vital signs and the nursing notes.  Pertinent labs & imaging results that were available during my care of the patient were reviewed by me and considered in my medical decision making (see chart for details).     Physical exam is consistent with possible yeast infection.  Will treat with Diflucan.  Do not think that urinalysis was necessary but patient wished to have urinalysis completed.  It did show small amount of leukocytes which could be due to vaginitis.  Although, patient reports this feels like a urinary tract infection.  Will opt to treat with cephalexin.  Urine culture is pending.  Vaginal swab is also pending.  Urine pregnancy was negative.  Quantitative hCG pending due to missed menstrual cycle.  Patient to follow-up with provided contact information for gynecology due to missed menstrual cycle as well as persistent vaginal symptoms.  Discussed return precautions.  Patient verbalized understanding and was agreeable with plan. Final Clinical Impressions(s) / UC Diagnoses   Final diagnoses:  Vaginal discharge  Dysuria  Urinary frequency  Missed menses     Discharge Instructions      You are being treated  with antibiotic and Diflucan.  Urine pregnancy test was negative. Vaginal swab and urine culture pending.  Please follow-up with provided contact information for gynecology for further evaluation and management.    ED Prescriptions     Medication Sig Dispense Auth. Provider   fluconazole (DIFLUCAN) 150 MG tablet Take 1 tablet (150 mg total) by mouth every 3 (three) days. May take second pill in 3 days if no resolution of symptoms with first pill. 2 tablet [place], [place] E, [place]   cephALEXin (KEFLEX) 500 MG capsule Take 1 capsule (500 mg total) by mouth 4 (four) times daily for 5 days. 20 capsule Teodora Medici, [place]      PDMP not reviewed this encounter.   Teodora Medici, [place] 03/21/21 1524

## 2021-03-21 NOTE — Discharge Instructions (Signed)
You are being treated with antibiotic and Diflucan.  Urine pregnancy test was negative. Vaginal swab and urine culture pending.  Please follow-up with provided contact information for gynecology for further evaluation and management.

## 2021-03-21 NOTE — ED Triage Notes (Signed)
Patient states she was seen last week for a yeast infection, tests came back negative.  Patient is very uncomfortable in her vagina area, not sure if it's from shaving or not.

## 2021-03-22 ENCOUNTER — Telehealth (HOSPITAL_COMMUNITY): Payer: Self-pay | Admitting: Emergency Medicine

## 2021-03-22 LAB — CERVICOVAGINAL ANCILLARY ONLY
Bacterial Vaginitis (gardnerella): POSITIVE — AB
Candida Glabrata: NEGATIVE
Candida Vaginitis: NEGATIVE
Chlamydia: NEGATIVE
Comment: NEGATIVE
Comment: NEGATIVE
Comment: NEGATIVE
Comment: NEGATIVE
Comment: NEGATIVE
Comment: NORMAL
Neisseria Gonorrhea: NEGATIVE
Trichomonas: NEGATIVE

## 2021-03-22 LAB — URINE CULTURE

## 2021-03-22 LAB — BETA HCG QUANT (REF LAB): hCG Quant: <1 m[IU]/mL

## 2021-03-22 MED ORDER — METRONIDAZOLE 500 MG PO TABS: 500.0000 mg | ORAL_TABLET | Freq: Two times a day (BID) | ORAL | 0 refills | Status: DC

## 2021-04-17 ENCOUNTER — Other Ambulatory Visit: Payer: Self-pay

## 2021-04-17 ENCOUNTER — Encounter (HOSPITAL_COMMUNITY): Payer: Self-pay

## 2021-04-17 ENCOUNTER — Ambulatory Visit (HOSPITAL_COMMUNITY)
Admission: EM | Admit: 2021-04-17 | Discharge: 2021-04-17 | Disposition: A | Discharge status: Home / Self Care | Payer: Medicaid Other | Attending: Urgent Care | Admitting: Urgent Care

## 2021-04-17 DIAGNOSIS — N760 Acute vaginitis: Secondary | ICD-10-CM | POA: Diagnosis not present

## 2021-04-17 DIAGNOSIS — R300 Dysuria: Secondary | ICD-10-CM | POA: Insufficient documentation

## 2021-04-17 LAB — POCT URINALYSIS DIPSTICK, ED / UC
Bilirubin Urine: NEGATIVE
Glucose, UA: NEGATIVE mg/dL
Hgb urine dipstick: NEGATIVE
Nitrite: NEGATIVE
Protein, ur: NEGATIVE mg/dL
Specific Gravity, Urine: 1.02 (ref 1.005–1.030)
Urobilinogen, UA: 1 mg/dL (ref 0.0–1.0)
pH: 6.5 (ref 5.0–8.0)

## 2021-04-17 LAB — POC URINE PREG, ED: Preg Test, Ur: NEGATIVE

## 2021-04-17 LAB — CBG MONITORING, ED: Glucose-Capillary: 84 mg/dL (ref 70–99)

## 2021-04-17 NOTE — ED Provider Notes (Signed)
[place]    CSN: [id number] Arrival date & time: 04/17/21  1015      History   Chief Complaint Chief Complaint  Patient presents with   Vaginitis    HPI Jill Neal is a 21 y.o. female.   Pleasant 21 year old female presents today with recurrent vaginal issues.  She was seen initially January 12, had a swab done which was apparently negative.  She was then seen again later that month and had another swab performed.  At that time is positive for bacterial vaginosis.  She has completed all of her fluconazole and her metronidazole and feels that the symptoms still are unchanged.  She states she is also having urinary symptoms.  She recently completed a course of macrobid. She feels it is uncomfortable when she urinates.  She states she has vaginal itching, but only at night and "not like anything I've ever had before." She denies hematuria or urgency.  She states there is still a white discharge.  She denies any known STI exposure.  Thorough chart review reveals recurrent history of both yeast and BV for the past 2 years, negative for STIs in the past 2 years.  Patient denies any pelvic pain.  She denies any rash, lesions, or irritation.    Past Medical History:  Diagnosis Date   Anemia    Anemia in pregnancy, third trimester 03/06/2019   Hgb 9.8 at 28 week visit Rx for Ferrous Sulfate ordered   Chlamydia    Post-dates pregnancy 05/29/2019   Pregnancy affected by fetal growth restriction 05/29/2019   Short cervical length during pregnancy in second trimester 01/20/2019   2.7 cm on 01/20/19   Supervision of low-risk pregnancy 11/12/2018   BABYSCRIPTS PATIENT: [x ]Initial [x ]12 [x20 [x] 28 [ ] 32 [ ] 36 [ ] 38 [ ] 39 [ ] 40  Nursing Staff Provider Office Location  [facility] Dating   LMP and 6.1 week [place] Language   English Anatomy US   Normal  Flu Vaccine  12/04/2018 Genetic Screen   NIPS: low risk  AFP:   negative  TDaP vaccine   03/05/19 Hgb A1C or  GTT Early 5.2 Third trimester  79-108-93 Rhogam   N/A   LAB RESULTS  Feeding Plan  Bottle Bloo   UTI (lower urinary tract infection)     Patient Active Problem List   Diagnosis Date Noted   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 Authorizing Provider  ibuprofen (ADVIL) 600 MG tablet Take 1 tablet (600 mg total) by mouth every 6 (six) hours as needed. 03/08/20   LampteyMyrene Galas, MD  Probiotic Product (UP4 PROBIOTICS PO) Take by mouth.    [provider]  ferrous sulfate 325 (65 FE) MG tablet Take 1 tablet (325 mg total) by mouth 3 (three) times daily with meals. Patient not taking:  No sig reported 03/10/19 03/08/20  Truett Mainland, DO  montelukast (SINGULAIR) 10 MG tablet Take 1 tablet (10 mg total) by mouth at bedtime. Patient not taking: Reported on 10/03/2018 11/22/17 10/03/18  Robyn Haber, MD    Family History Family History  Problem Relation Age of Onset   Hypertension Other    Healthy Mother    Healthy Father     Social History Social History   Tobacco Use   Smoking status: Never   Smokeless tobacco: Never  Vaping Use   Vaping Use: Former  Substance Use Topics   Alcohol use: Not Currently   Drug use: Not Currently    Types: Marijuana    Comment: last used July     Allergies   Patient has no known allergies.   Review of Systems Review of Systems  Genitourinary:  Positive for dysuria and vaginal discharge.  All other systems reviewed and are negative.   Physical Exam Triage Vital Signs ED Triage Vitals  Enc Vitals Group     BP 04/17/21 1100 114/75     Pulse Rate 04/17/21 1100 89      Resp 04/17/21 1100 18     Temp 04/17/21 1100 98.9 F (37.2 C)     Temp Source 04/17/21 1100 Oral     SpO2 04/17/21 1100 98 %     Weight --      Height --      Head Circumference --      Peak Flow --      Pain Score 04/17/21 1101 4     Pain Loc --      Pain Edu? --      Excl. in [place]? --    No data found.  Updated Vital Signs BP 114/75 (BP Location: Right Arm)    Pulse 89    Temp 98.9 F (37.2 C) (Oral)    Resp 18    SpO2 98%   Visual Acuity Right Eye Distance:   Left Eye Distance:   Bilateral Distance:    Right Eye Near:   Left Eye Near:    Bilateral Near:     Physical Exam Vitals and nursing note reviewed.  Constitutional:      General: She is not in acute distress.    Appearance: Normal appearance. She is well-developed. She is obese. She is not ill-appearing, toxic-appearing or diaphoretic.  HENT:     Head: Normocephalic and atraumatic.  Eyes:     Conjunctiva/sclera: Conjunctivae normal.  Cardiovascular:     Rate and Rhythm: Normal rate and regular rhythm.     Heart sounds: No murmur heard. Pulmonary:     Effort: Pulmonary effort is normal. No respiratory distress.     Breath sounds: Normal breath sounds.  Abdominal:     Palpations: Abdomen is soft.     Tenderness: There is no abdominal tenderness.  Musculoskeletal:        General: No swelling.     Cervical back: Neck supple.  Skin:    General: Skin is warm and dry.     Capillary Refill: Capillary refill takes less than 2 seconds.  Neurological:     Mental Status: She is alert.  Psychiatric:        Mood and Affect: Mood normal.     UC Treatments / Results  Labs (all labs ordered are listed, but only abnormal results are displayed) Labs Reviewed  POCT URINALYSIS DIPSTICK, ED / UC - Abnormal; Notable for the following components:  Result Value   Ketones, ur TRACE (*)    Leukocytes,Ua SMALL (*)    All other components within normal limits  URINE CULTURE  POC URINE PREG, ED  CBG MONITORING, ED   CERVICOVAGINAL ANCILLARY ONLY    EKG   Radiology No results found.  Procedures Procedures (including critical care time)  Medications Ordered in UC Medications - No data to display  Initial Impression / Assessment and Plan / UC Course  I have reviewed the triage vital signs and the nursing notes.  Pertinent labs & imaging results that were available during my care of the patient were reviewed by me and considered in my medical decision making (see chart for details).     Dysuria -most recent urine culture showed multiple organisms present, recommended reculture.  Patient does not want empiric treatment today, she is requesting to wait into the results of the culture are obtained. Vaginitis -Aptima swab again performed.  Due to many recent antibiotics, patient is requesting to wait until the results are saved prior to starting empiric treatment.  Referral to gynecology given to patient, question if she might need Ureaplasma or mycoplasma testing due to her recurrence.  We will call with the results of testing today.  Final Clinical Impressions(s) / UC Diagnoses   Final diagnoses:  Dysuria  Vaginitis and vulvovaginitis     Discharge Instructions      We have recultured your urine today.  We will call with results of the culture. We will send out the Aptima swab and call with the results. You have deferred any treatment today. Please follow-up with the specialist.     ED Prescriptions   None    PDMP not reviewed this encounter.   Chaney Malling, [place] 04/17/21 1202

## 2021-04-17 NOTE — Discharge Instructions (Addendum)
We have recultured your urine today.  We will call with results of the culture. We will send out the Aptima swab and call with the results. You have deferred any treatment today. Please follow-up with the specialist.

## 2021-04-17 NOTE — ED Triage Notes (Signed)
Pt presents with ongoing vaginal irritation X 3 weeks.

## 2021-04-18 ENCOUNTER — Ambulatory Visit (HOSPITAL_COMMUNITY): Payer: Medicaid Other

## 2021-04-18 LAB — CERVICOVAGINAL ANCILLARY ONLY
Bacterial Vaginitis (gardnerella): NEGATIVE
Candida Glabrata: NEGATIVE
Candida Vaginitis: NEGATIVE
Chlamydia: NEGATIVE
Comment: NEGATIVE
Comment: NEGATIVE
Comment: NEGATIVE
Comment: NEGATIVE
Comment: NEGATIVE
Comment: NORMAL
Neisseria Gonorrhea: NEGATIVE
Trichomonas: NEGATIVE

## 2021-04-18 LAB — URINE CULTURE: Culture: <10000 — AB

## 2021-05-11 ENCOUNTER — Encounter: Payer: Self-pay | Admitting: Emergency Medicine

## 2021-05-11 ENCOUNTER — Ambulatory Visit
Admission: EM | Admit: 2021-05-11 | Discharge: 2021-05-11 | Disposition: A | Discharge status: Home / Self Care | Payer: Medicaid Other | Attending: Internal Medicine | Admitting: Internal Medicine

## 2021-05-11 ENCOUNTER — Other Ambulatory Visit: Payer: Self-pay

## 2021-05-11 DIAGNOSIS — N898 Other specified noninflammatory disorders of vagina: Secondary | ICD-10-CM | POA: Diagnosis present

## 2021-05-11 DIAGNOSIS — Z113 Encounter for screening for infections with a predominantly sexual mode of transmission: Secondary | ICD-10-CM | POA: Insufficient documentation

## 2021-05-11 DIAGNOSIS — R300 Dysuria: Secondary | ICD-10-CM | POA: Insufficient documentation

## 2021-05-11 LAB — POCT URINALYSIS DIP (MANUAL ENTRY)
Bilirubin, UA: NEGATIVE
Glucose, UA: NEGATIVE mg/dL
Ketones, POC UA: NEGATIVE mg/dL
Nitrite, UA: NEGATIVE
Spec Grav, UA: >=1.03 — AB (ref 1.010–1.025)
Urobilinogen, UA: 1 E.U./dL
pH, UA: 6 (ref 5.0–8.0)

## 2021-05-11 LAB — POCT URINE PREGNANCY: Preg Test, Ur: NEGATIVE

## 2021-05-11 NOTE — Discharge Instructions (Signed)
Urinalysis did not show any abnormalities that warrant treatment.  Your vaginal swab is pending.  Your blood work is also pending.  We will call if there are any abnormalities and treat as appropriate.  I would advise to call number on back of your insurance card to see what gynecologists are covered by your insurance. ?

## 2021-05-11 NOTE — ED Provider Notes (Signed)
?[name] ? ? ? ?CSN: [id number] ?Arrival date & time: 05/11/21  1723 ? ? ?  ? ?History   ?Chief Complaint ?Chief Complaint  ?Patient presents with  ? Vaginitis  ? ? ?HPI ?Jill Neal is a 21 y.o. female.  ? ?Patient presents with vaginal irritation, vaginal discharge with vaginal odor, dysuria that started approximately 1 week ago.  Denies urinary frequency, urinary urgency, hematuria, abdominal pain, fever, back pain, pelvic pain.  Denies any known exposure to STD but is requesting STD testing including blood work for HIV and syphilis.  Last known menstrual cycle was at the approximately 2 weeks ago.  Patient does have recurrent bacterial vaginosis as well.  She reports that she attempted to get in with a gynecologist but was unable to find one that was covered by her insurance. ? ? ? ?Past Medical History:  ?Diagnosis Date  ? Anemia   ? Anemia in pregnancy, third trimester 03/06/2019  ? Hgb 9.8 at 28 week visit Rx for Ferrous Sulfate ordered  ? Chlamydia   ? Post-dates pregnancy 05/29/2019  ? Pregnancy affected by fetal growth restriction 05/29/2019  ? Short cervical length during pregnancy in second trimester 01/20/2019  ? 2.7 cm on 01/20/19  ? Supervision of low-risk pregnancy 11/12/2018  ? BABYSCRIPTS PATIENT: [x ]Initial [x ]12 [x20 [x] 28 [ ] 32 [ ] 36 [ ] 38 [ ] 39 [ ] 40  Nursing Staff Provider Office Location  [facility] Dating   LMP and 6.1 week [place] Language   English Anatomy US   Normal  Flu Vaccine  12/04/2018 Genetic Screen   NIPS: low risk  AFP:   negative  TDaP vaccine   03/05/19 Hgb A1C or  GTT Early 5.2 Third trimester 79-108-93 Rhogam   N/A   LAB RESULTS  Feeding Plan  Bottle Bloo  ? UTI (lower urinary tract infection)   ? ? ?Patient Active Problem List  ? Diagnosis Date Noted  ? 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 Depression 12/03/2018  ? ? ?Past Surgical History:  ?Procedure Laterality Date  ? NO PAST SURGERIES    ? ? ?OB History   ? ? Gravida  ?1  ? Para  ?1  ? Term  ?1  ? Preterm   ?0  ? AB  ?0  ? Living  ?1  ?  ? ? SAB  ?0  ? IAB  ?0  ? Ectopic  ?0  ? Multiple  ?0  ? Live Births  ?1  ?   ?  ?  ? ? ? ?Home Medications   ? ?Prior to Admission medications   ?Medication Sig Start Date End Date Taking? Authorizing Provider  ?ibuprofen (ADVIL) 600 MG tablet Take 1 tablet (600 mg total) by mouth every 6 (six) hours as needed. 03/08/20   Merrilee JanskyLamptey, Philip O, MD  ?Probiotic Product (UP4 PROBIOTICS PO) Take by mouth.    [provider]  ?ferrous sulfate 325 (65 FE) MG tablet Take 1 tablet (325 mg total) by mouth 3 (three) times daily with meals. ?Patient not taking: No sig reported 03/10/19 03/08/20  Levie HeritageStinson, Jacob J, DO  ?montelukast (SINGULAIR) 10 MG tablet Take 1 tablet (10 mg total) by mouth at bedtime. ?Patient not taking: Reported on 10/03/2018 11/22/17 10/03/18  Elvina SidleLauenstein, Kurt, MD  ? ? ?Family History ?Family History  ?Problem Relation Age of Onset  ? Hypertension Other   ? Healthy Mother   ? Healthy Father   ? ? ?Social History ?Social History  ? ?  Tobacco Use  ? Smoking status: Never  ? Smokeless tobacco: Never  ?Vaping Use  ? Vaping Use: Former  ?Substance Use Topics  ? Alcohol use: Not Currently  ? Drug use: Not Currently  ?  Types: Marijuana  ?  Comment: last used July  ? ? ? ?Allergies   ?Patient has no known allergies. ? ? ?Review of Systems ?Review of Systems ?Per HPI ? ?Physical Exam ?Triage Vital Signs ?ED Triage Vitals  ?Enc Vitals Group  ?   BP 05/11/21 1731 121/75  ?   Pulse Rate 05/11/21 1731 79  ?   Resp 05/11/21 1731 18  ?   Temp 05/11/21 1731 (!) 97.5 ?F (36.4 ?C)  ?   Temp Source 05/11/21 1731 Oral  ?   SpO2 05/11/21 1731 93 %  ?   Weight 05/11/21 1733 190 lb 0.6 oz (86.2 kg)  ?   Height 05/11/21 1733 5\' 7"  (1.702 m)  ?   Head Circumference --   ?   Peak Flow --   ?   Pain Score 05/11/21 1733 4  ?   Pain Loc --   ?   Pain Edu? --   ?   Excl. in [place]? --   ? ?No data found. ? ?Updated Vital Signs ?BP 121/75 (BP Location: Left Arm)   Pulse 79   Temp (!) 97.5 ?F (36.4 ?C) (Oral)    Resp 18   Ht 5\' 7"  (1.702 m)   Wt 190 lb 0.6 oz (86.2 kg)   LMP 04/27/2021 (Exact Date)   SpO2 93%   Breastfeeding No   BMI 29.76 kg/m?  ? ?Visual Acuity ?Right Eye Distance:   ?Left Eye Distance:   ?Bilateral Distance:   ? ?Right Eye Near:   ?Left Eye Near:    ?Bilateral Near:    ? ?Physical Exam ?Constitutional:   ?   General: She is not in acute distress. ?   Appearance: Normal appearance. She is not toxic-appearing or diaphoretic.  ?HENT:  ?   Head: Normocephalic and atraumatic.  ?Eyes:  ?   Extraocular Movements: Extraocular movements intact.  ?   Conjunctiva/sclera: Conjunctivae normal.  ?Cardiovascular:  ?   Rate and Rhythm: Normal rate and regular rhythm.  ?   Pulses: Normal pulses.  ?   Heart sounds: Normal heart sounds.  ?Pulmonary:  ?   Effort: Pulmonary effort is normal. No respiratory distress.  ?   Breath sounds: Normal breath sounds.  ?Abdominal:  ?   General: Abdomen is flat. Bowel sounds are normal. There is no distension.  ?   Palpations: Abdomen is soft.  ?   Tenderness: There is no abdominal tenderness.  ?Genitourinary: ?   Comments: Deferred with shared decision making.  Self swab performed. ?Neurological:  ?   General: No focal deficit present.  ?   Mental Status: She is alert and oriented to person, place, and time. Mental status is at baseline.  ?Psychiatric:     ?   Mood and Affect: Mood normal.     ?   Behavior: Behavior normal.     ?   Thought Content: Thought content normal.     ?   Judgment: Judgment normal.  ? ? ? ?UC Treatments / Results  ?Labs ?(all labs ordered are listed, but only abnormal results are displayed) ?Labs Reviewed  ?POCT URINALYSIS DIP (MANUAL ENTRY) - Abnormal; Notable for the following components:  ?    Result Value  ? Clarity, UA cloudy (*)   ?  Spec Grav, UA >=1.030 (*)   ? Blood, UA trace-intact (*)   ? Protein Ur, POC trace (*)   ? Leukocytes, UA Trace (*)   ? All other components within normal limits  ?URINE CULTURE  ?HIV ANTIBODY (ROUTINE TESTING W  REFLEX)  ?RPR  ?POCT URINE PREGNANCY  ?CERVICOVAGINAL ANCILLARY ONLY  ? ? ?EKG ? ? ?Radiology ?No results found. ? ?Procedures ?Procedures (including critical care time) ? ?Medications Ordered in UC ?Medications - No data to display ? ?Initial Impression / Assessment and Plan / UC Course  ?I have reviewed the triage vital signs and the nursing notes. ? ?Pertinent labs & imaging results that were available during my care of the patient were reviewed by me and considered in my medical decision making (see chart for details). ? ?  ? ?Cervicovaginal swab pending.  Will await results before treatment. Urinalysis did not show any signs of urinary tract infection.  Urine culture is pending.  HIV and RPR test pending per patient request as well.  Patient was agreeable to waiting for results for treatment.  Patient advised to follow-up with gynecologist for further evaluation and management given recurrent vaginal issues.  Discussed strict return precautions.  Patient verbalized understanding and was agreeable with plan. ?Final Clinical Impressions(s) / UC Diagnoses  ? ?Final diagnoses:  ?Vaginal discharge  ?Dysuria  ?Screening examination for venereal disease  ? ? ? ?Discharge Instructions   ? ?  ?Urinalysis did not show any abnormalities that warrant treatment.  Your vaginal swab is pending.  Your blood work is also pending.  We will call if there are any abnormalities and treat as appropriate.  I would advise to call number on back of your insurance card to see what gynecologists are covered by your insurance. ? ? ? ?ED Prescriptions   ?None ?  ? ?PDMP not reviewed this encounter. ?  ?Gustavus Bryant, [place] ?05/11/21 1752 ? ?

## 2021-05-11 NOTE — ED Triage Notes (Signed)
Patient c/o vaginal irritation, dysuria for x 1 week.  White discharge w/some odor.  Requesting STI testing. ?

## 2021-05-12 LAB — CERVICOVAGINAL ANCILLARY ONLY
Bacterial Vaginitis (gardnerella): NEGATIVE
Candida Glabrata: NEGATIVE
Candida Vaginitis: NEGATIVE
Chlamydia: NEGATIVE
Comment: NEGATIVE
Comment: NEGATIVE
Comment: NEGATIVE
Comment: NEGATIVE
Comment: NEGATIVE
Comment: NORMAL
Neisseria Gonorrhea: NEGATIVE
Trichomonas: NEGATIVE

## 2021-05-12 LAB — SYPHILIS: RPR W/REFLEX TO RPR TITER AND TREPONEMAL ANTIBODIES, TRADITIONAL SCREENING AND DIAGNOSIS ALGORITHM: RPR Ser Ql: NONREACTIVE

## 2021-05-12 LAB — HIV ANTIBODY (ROUTINE TESTING W REFLEX): HIV Screen 4th Generation wRfx: NONREACTIVE

## 2021-05-13 LAB — URINE CULTURE

## 2021-06-05 ENCOUNTER — Ambulatory Visit
Admission: EM | Admit: 2021-06-05 | Discharge: 2021-06-05 | Disposition: A | Discharge status: Home / Self Care | Payer: Medicaid Other | Attending: Student | Admitting: Student

## 2021-06-05 ENCOUNTER — Other Ambulatory Visit: Payer: Self-pay

## 2021-06-05 ENCOUNTER — Encounter: Payer: Self-pay | Admitting: Emergency Medicine

## 2021-06-05 DIAGNOSIS — N760 Acute vaginitis: Secondary | ICD-10-CM | POA: Diagnosis not present

## 2021-06-05 LAB — POCT URINALYSIS DIP (MANUAL ENTRY)
Bilirubin, UA: NEGATIVE
Blood, UA: NEGATIVE
Glucose, UA: NEGATIVE mg/dL
Ketones, POC UA: NEGATIVE mg/dL
Leukocytes, UA: NEGATIVE
Nitrite, UA: NEGATIVE
Protein Ur, POC: NEGATIVE mg/dL
Spec Grav, UA: >=1.03 — AB (ref 1.010–1.025)
Urobilinogen, UA: 0.2 E.U./dL
pH, UA: 6 (ref 5.0–8.0)

## 2021-06-05 LAB — POCT URINE PREGNANCY: Preg Test, Ur: NEGATIVE

## 2021-06-05 NOTE — Discharge Instructions (Addendum)
-  We have sent testing for sexually transmitted infections. We will notify you of any positive results once they are received. If required, we will prescribe any medications you might need. Please refrain from all sexual activity until treatment is complete.  -Seek additional medical attention if you develop fevers/chills, new/worsening abdominal pain, new/worsening vaginal discomfort/discharge, etc.   

## 2021-06-05 NOTE — ED Triage Notes (Signed)
Pt here for vaginal discharge and irritation with some urinary frequency; pt sts similar to when had BV in past  ?

## 2021-06-05 NOTE — ED Provider Notes (Signed)
?[name] ? ? ? ?CSN: [id number] ?Arrival date & time: 06/05/21  1047 ? ? ?  ? ?History   ?Chief Complaint ?Chief Complaint  ?Patient presents with  ? Vaginal Discharge  ? ? ?HPI ?Jill Neal is a 21 y.o. female presenting with vaginitis for 1 week.  History recurrent BV.  Describes 1 week of thick white cottage cheese discharge and urinary frequency.  States her vaginal odor is different than normal, but not malodorous.  Some crampy lower abdominal pain.  Denies vaginal rash or lesion.  Denies STI risk.  Denies changes to routine including new soap or fragrance.  Denies antibiotics within the last month. ? ?HPI ? ?Past Medical History:  ?Diagnosis Date  ? Anemia   ? Anemia in pregnancy, third trimester 03/06/2019  ? Hgb 9.8 at 28 week visit Rx for Ferrous Sulfate ordered  ? Chlamydia   ? Post-dates pregnancy 05/29/2019  ? Pregnancy affected by fetal growth restriction 05/29/2019  ? Short cervical length during pregnancy in second trimester 01/20/2019  ? 2.7 cm on 01/20/19  ? Supervision of low-risk pregnancy 11/12/2018  ? BABYSCRIPTS PATIENT: [x ]Initial [x ]12 [x20 [x] 28 [ ] 32 [ ] 36 [ ] 38 [ ] 39 [ ] 40  Nursing Staff Provider Office Location  [facility] Dating   LMP and 6.1 week Language   English Anatomy   Normal  Flu Vaccine  12/04/2018 Genetic Screen   NIPS: low risk  AFP:   negative  TDaP vaccine   03/05/19 Hgb A1C or  GTT Early 5.2 Third trimester 79-108-93 Rhogam   N/A   LAB RESULTS  Feeding Plan  Bottle Bloo  ? UTI (lower urinary tract infection)   ? ? ?Patient Active Problem List  ? Diagnosis Date Noted  ? 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 Depression 12/03/2018  ? ? ?Past Surgical History:  ?Procedure Laterality Date  ? NO PAST SURGERIES    ? ? ?OB History   ? ? Gravida  ?1  ? Para  ?1  ? Term  ?1  ? Preterm  ?0  ? AB  ?0  ? Living  ?1  ?  ? ? SAB  ?0  ? IAB  ?0  ? Ectopic  ?0  ? Multiple  ?0  ? Live Births  ?1  ?   ?  ?  ? ? ? ?Home Medications   ? ?Prior to Admission medications    ?Medication Sig Start Date End Date Taking? Authorizing Provider  ?ibuprofen (ADVIL) 600 MG tablet Take 1 tablet (600 mg total) by mouth every 6 (six) hours as needed. 03/08/20   02/03/2019, MD  ?Probiotic Product (UP4 PROBIOTICS PO) Take by mouth.    [provider]  ?ferrous sulfate 325 (65 FE) MG tablet Take 1 tablet (325 mg total) by mouth 3 (three) times daily with meals. ?Patient not taking: No sig reported 03/10/19 03/08/20  01/15/2019, DO  ?montelukast (SINGULAIR) 10 MG tablet Take 1 tablet (10 mg total) by mouth at bedtime. ?Patient not taking: Reported on 10/03/2018 11/22/17 10/03/18  05/08/19, MD  ? ? ?Family History ?Family History  ?Problem Relation Age of Onset  ? Hypertension Other   ? Healthy Mother   ? Healthy Father   ? ? ?Social History ?Social History  ? ?Tobacco Use  ? Smoking status: Never  ? Smokeless tobacco: Never  ?Vaping Use  ? Vaping Use: Former  ?Substance Use Topics  ? Alcohol use:  Not Currently  ? Drug use: Not Currently  ?  Types: Marijuana  ?  Comment: last used July  ? ? ? ?Allergies   ?Patient has no known allergies. ? ? ?Review of Systems ?Review of Systems  ?Constitutional:  Negative for chills and fever.  ?HENT:  Negative for sore throat.   ?Eyes:  Negative for pain and redness.  ?Respiratory:  Negative for shortness of breath.   ?Cardiovascular:  Negative for chest pain.  ?Gastrointestinal:  Negative for abdominal pain, diarrhea, nausea and vomiting.  ?Genitourinary:  Positive for vaginal discharge. Negative for decreased urine volume, difficulty urinating, dysuria, flank pain, frequency, genital sores, hematuria and urgency.  ?Musculoskeletal:  Negative for back pain.  ?Skin:  Negative for rash.  ?All other systems reviewed and are negative. ? ? ?Physical Exam ?Triage Vital Signs ?ED Triage Vitals [06/05/21 1120]  ?Enc Vitals Group  ?   BP 108/71  ?   Pulse Rate 85  ?   Resp 18  ?   Temp 98.3 ?F (36.8 ?C)  ?   Temp Source Oral  ?   SpO2 97 %  ?   Weight    ?   Height   ?   Head Circumference   ?   Peak Flow   ?   Pain Score 4  ?   Pain Loc   ?   Pain Edu?   ?   Excl. in [place]?   ? ?No data found. ? ?Updated Vital Signs ?BP 108/71 (BP Location: Left Arm)   Pulse 85   Temp 98.3 ?F (36.8 ?C) (Oral)   Resp 18   SpO2 97%  ? ?Visual Acuity ?Right Eye Distance:   ?Left Eye Distance:   ?Bilateral Distance:   ? ?Right Eye Near:   ?Left Eye Near:    ?Bilateral Near:    ? ?Physical Exam ?Vitals reviewed.  ?Constitutional:   ?   General: She is not in acute distress. ?   Appearance: Normal appearance. She is not ill-appearing.  ?HENT:  ?   Head: Normocephalic and atraumatic.  ?   Mouth/Throat:  ?   Mouth: Mucous membranes are moist.  ?   Comments: Moist mucous membranes ?Eyes:  ?   Extraocular Movements: Extraocular movements intact.  ?   Pupils: Pupils are equal, round, and reactive to light.  ?Cardiovascular:  ?   Rate and Rhythm: Normal rate and regular rhythm.  ?   Heart sounds: Normal heart sounds.  ?Pulmonary:  ?   Effort: Pulmonary effort is normal.  ?   Breath sounds: Normal breath sounds. No wheezing, rhonchi or rales.  ?Abdominal:  ?   General: Bowel sounds are normal. There is no distension.  ?   Palpations: Abdomen is soft. There is no mass.  ?   Tenderness: There is abdominal tenderness in the right lower quadrant. There is no right CVA tenderness, left CVA tenderness, guarding or rebound. Negative signs include Murphy's sign, Rovsing's sign and McBurney's sign.  ?   Comments: Very mildly TTP RLQ without guarding, rebound, mass, hernia. Comfortable throughout exam.   ?Skin: ?   General: Skin is warm.  ?   Capillary Refill: Capillary refill takes less than 2 seconds.  ?   Comments: Good skin turgor  ?Neurological:  ?   General: No focal deficit present.  ?   Mental Status: She is alert and oriented to person, place, and time.  ?Psychiatric:     ?   Mood and  Affect: Mood normal.     ?   Behavior: Behavior normal.  ? ? ? ?UC Treatments / Results  ?Labs ?(all labs  ordered are listed, but only abnormal results are displayed) ?Labs Reviewed  ?POCT URINALYSIS DIP (MANUAL ENTRY) - Abnormal; Notable for the following components:  ?    Result Value  ? Spec Grav, UA >=1.030 (*)   ? All other components within normal limits  ?POCT URINE PREGNANCY  ?CERVICOVAGINAL ANCILLARY ONLY  ? ? ?EKG ? ? ?Radiology ?No results found. ? ?Procedures ?Procedures (including critical care time) ? ?Medications Ordered in UC ?Medications - No data to display ? ?Initial Impression / Assessment and Plan / UC Course  ?I have reviewed the triage vital signs and the nursing notes. ? ?Pertinent labs & imaging results that were available during my care of the patient were reviewed by me and considered in my medical decision making (see chart for details). ? ?  ? ?This patient is a very pleasant 21 y.o. year old female presenting with vaginitis.afebrile, nontachy. ? ?Denies STI risk. Will send self-swab for G/C, trich, yeast, BV testing. Declines HIV, RPR. Safe sex precautions.  ?U-preg negative.  ?UA wnl, did not send culture . ? ?Prefers to wait to treat.  ? ?ED return precautions discussed. Patient verbalizes understanding and agreement.  ? ? ?Final Clinical Impressions(s) / UC Diagnoses  ? ?Final diagnoses:  ?Vaginitis and vulvovaginitis  ? ? ? ?Discharge Instructions   ? ?  ?-We have sent testing for sexually transmitted infections. We will notify you of any positive results once they are received. If required, we will prescribe any medications you might need. Please refrain from all sexual activity until treatment is complete.  ?-Seek additional medical attention if you develop fevers/chills, new/worsening abdominal pain, new/worsening vaginal discomfort/discharge, etc.  ? ? ? ?ED Prescriptions   ?None ?  ? ?PDMP not reviewed this encounter. ?  ?Rhys Martini, PA-C ?06/05/21 1153 ? ?

## 2021-06-06 LAB — CERVICOVAGINAL ANCILLARY ONLY
Bacterial Vaginitis (gardnerella): NEGATIVE
Candida Glabrata: NEGATIVE
Candida Vaginitis: NEGATIVE
Chlamydia: NEGATIVE
Comment: NEGATIVE
Comment: NEGATIVE
Comment: NEGATIVE
Comment: NEGATIVE
Comment: NEGATIVE
Comment: NORMAL
Neisseria Gonorrhea: NEGATIVE
Trichomonas: NEGATIVE

## 2021-07-02 ENCOUNTER — Ambulatory Visit: Payer: Medicaid Other

## 2021-07-05 ENCOUNTER — Ambulatory Visit: Payer: Medicaid Other

## 2021-07-07 ENCOUNTER — Ambulatory Visit
Admission: RE | Admit: 2021-07-07 | Discharge: 2021-07-07 | Disposition: A | Discharge status: Home / Self Care | Payer: Medicaid Other | Source: Ambulatory Visit | Attending: Student | Admitting: Student

## 2021-07-07 VITALS — BP 117/85 | HR 75 | Temp 98.2°F | Resp 18

## 2021-07-07 DIAGNOSIS — R350 Frequency of micturition: Secondary | ICD-10-CM | POA: Diagnosis not present

## 2021-07-07 LAB — POCT URINALYSIS DIP (MANUAL ENTRY)
Bilirubin, UA: NEGATIVE
Blood, UA: NEGATIVE
Glucose, UA: NEGATIVE mg/dL
Ketones, POC UA: NEGATIVE mg/dL
Leukocytes, UA: NEGATIVE
Nitrite, UA: NEGATIVE
Protein Ur, POC: NEGATIVE mg/dL
Spec Grav, UA: 1.025 (ref 1.010–1.025)
Urobilinogen, UA: 1 E.U./dL — AB
pH, UA: 7 (ref 5.0–8.0)

## 2021-07-07 LAB — POCT URINE PREGNANCY: Preg Test, Ur: NEGATIVE

## 2021-07-07 LAB — POCT FASTING CBG KUC MANUAL ENTRY: POCT Glucose (KUC): 92 mg/dL (ref 70–99)

## 2021-07-07 NOTE — ED Provider Notes (Signed)
?[name] ? ? ? ?CSN: [id number] ?Arrival date & time: 07/07/21  1154 ? ? ?  ? ?History   ?Chief Complaint ?Chief Complaint  ?Patient presents with  ? Possible Pregnancy  ?  Entered by patient  ? Urinary Frequency  ? ? ?HPI ?Jill Neal is a 21 y.o. female presenting with urinary frequency for a few days, and she is about 38 days late for her period.  History UTI, low risk pregnancy.  States she is peeing more often, but is not more thirsty - drinking the same amount of water. States her last unprotected intercourse was about 3 weeks ago.  Negative home pregnancy test.  She is feeling well otherwise, without abdominal pain, flank pain, external vaginal irritation, dysuria, vaginal discharge, fever/chills. ? ?HPI ? ?Past Medical History:  ?Diagnosis Date  ? Anemia   ? Anemia in pregnancy, third trimester 03/06/2019  ? Hgb 9.8 at 28 week visit Rx for Ferrous Sulfate ordered  ? Chlamydia   ? Post-dates pregnancy 05/29/2019  ? Pregnancy affected by fetal growth restriction 05/29/2019  ? Short cervical length during pregnancy in second trimester 01/20/2019  ? 2.7 cm on 01/20/19  ? Supervision of low-risk pregnancy 11/12/2018  ? BABYSCRIPTS PATIENT: [x ]Initial [x ]12 [x20 [x] 28 [ ] 32 [ ] 36 [ ] 38 [ ] 39 [ ] 40  Nursing Staff Provider Office Location  [facility] Dating   LMP and 6.1 week Language   English Anatomy   Normal  Flu Vaccine  12/04/2018 Genetic Screen   NIPS: low risk  AFP:   negative  TDaP vaccine   03/05/19 Hgb A1C or  GTT Early 5.2 Third trimester 79-108-93 Rhogam   N/A   LAB RESULTS  Feeding Plan  Bottle Bloo  ? UTI (lower urinary tract infection)   ? ? ?Patient Active Problem List  ? Diagnosis Date Noted  ? 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 Depression 12/03/2018  ? ? ?Past Surgical History:  ?Procedure Laterality Date  ? NO PAST SURGERIES    ? ? ?OB History   ? ? Gravida  ?1  ? Para  ?1  ? Term  ?1  ? Preterm  ?0  ? AB  ?0  ? Living  ?1  ?  ? ? SAB  ?0  ? IAB  ?0  ? Ectopic  ?0  ?  Multiple  ?0  ? Live Births  ?1  ?   ?  ?  ? ? ? ?Home Medications   ? ?Prior to Admission medications   ?Medication Sig Start Date End Date Taking? Authorizing Provider  ?ibuprofen (ADVIL) 600 MG tablet Take 1 tablet (600 mg total) by mouth every 6 (six) hours as needed. 03/08/20   02/03/2019, MD  ?Probiotic Product (UP4 PROBIOTICS PO) Take by mouth.    [provider]  ?ferrous sulfate 325 (65 FE) MG tablet Take 1 tablet (325 mg total) by mouth 3 (three) times daily with meals. ?Patient not taking: No sig reported 03/10/19 03/08/20  01/15/2019, DO  ?montelukast (SINGULAIR) 10 MG tablet Take 1 tablet (10 mg total) by mouth at bedtime. ?Patient not taking: Reported on 10/03/2018 11/22/17 10/03/18  05/08/19, MD  ? ? ?Family History ?Family History  ?Problem Relation Age of Onset  ? Hypertension Other   ? Healthy Mother   ? Healthy Father   ? ? ?Social History ?Social History  ? ?Tobacco Use  ? Smoking status: Never  ? Smokeless  tobacco: Never  ?Vaping Use  ? Vaping Use: Former  ?Substance Use Topics  ? Alcohol use: Not Currently  ? Drug use: Not Currently  ?  Types: Marijuana  ?  Comment: last used July  ? ? ? ?Allergies   ?Patient has no known allergies. ? ? ?Review of Systems ?Review of Systems  ?Constitutional:  Negative for chills and fever.  ?HENT:  Negative for sore throat.   ?Eyes:  Negative for pain and redness.  ?Respiratory:  Negative for shortness of breath.   ?Cardiovascular:  Negative for chest pain.  ?Gastrointestinal:  Negative for abdominal pain, diarrhea, nausea and vomiting.  ?Genitourinary:  Positive for frequency. Negative for decreased urine volume, difficulty urinating, dysuria, flank pain, genital sores, hematuria and urgency.  ?Musculoskeletal:  Negative for back pain.  ?Skin:  Negative for rash.  ?All other systems reviewed and are negative. ? ? ?Physical Exam ?Triage Vital Signs ?ED Triage Vitals  ?Enc Vitals Group  ?   BP 07/07/21 1210 117/85  ?   Pulse Rate 07/07/21  1210 75  ?   Resp 07/07/21 1210 18  ?   Temp 07/07/21 1210 98.2 ?F (36.8 ?C)  ?   Temp Source 07/07/21 1210 Oral  ?   SpO2 07/07/21 1210 97 %  ?   Weight --   ?   Height --   ?   Head Circumference --   ?   Peak Flow --   ?   Pain Score 07/07/21 1209 0  ?   Pain Loc --   ?   Pain Edu? --   ?   Excl. in GC? --   ? ?No data found. ? ?Updated Vital Signs ?BP 117/85 (BP Location: Right Arm)   Pulse 75   Temp 98.2 ?F (36.8 ?C) (Oral)   Resp 18   SpO2 97%  ? ?Visual Acuity ?Right Eye Distance:   ?Left Eye Distance:   ?Bilateral Distance:   ? ?Right Eye Near:   ?Left Eye Near:    ?Bilateral Near:    ? ?Physical Exam ?Vitals reviewed.  ?Constitutional:   ?   General: She is not in acute distress. ?   Appearance: Normal appearance. She is not ill-appearing.  ?HENT:  ?   Head: Normocephalic and atraumatic.  ?   Mouth/Throat:  ?   Mouth: Mucous membranes are moist.  ?   Comments: Moist mucous membranes ?Eyes:  ?   Extraocular Movements: Extraocular movements intact.  ?   Pupils: Pupils are equal, round, and reactive to light.  ?Cardiovascular:  ?   Rate and Rhythm: Normal rate and regular rhythm.  ?   Heart sounds: Normal heart sounds.  ?Pulmonary:  ?   Effort: Pulmonary effort is normal.  ?   Breath sounds: Normal breath sounds. No wheezing, rhonchi or rales.  ?Abdominal:  ?   General: Bowel sounds are normal. There is no distension.  ?   Palpations: Abdomen is soft. There is no mass.  ?   Tenderness: There is no abdominal tenderness. There is no right CVA tenderness, left CVA tenderness, guarding or rebound.  ?Skin: ?   General: Skin is warm.  ?   Capillary Refill: Capillary refill takes less than 2 seconds.  ?   Comments: Good skin turgor  ?Neurological:  ?   General: No focal deficit present.  ?   Mental Status: She is alert and oriented to person, place, and time.  ?Psychiatric:     ?  Mood and Affect: Mood normal.     ?   Behavior: Behavior normal.  ? ? ? ?UC Treatments / Results  ?Labs ?(all labs ordered are  listed, but only abnormal results are displayed) ?Labs Reviewed  ?POCT URINALYSIS DIP (MANUAL ENTRY) - Abnormal; Notable for the following components:  ?    Result Value  ? Urobilinogen, UA 1.0 (*)   ? All other components within normal limits  ?POCT URINE PREGNANCY  ?POCT FASTING CBG [facility] MANUAL ENTRY  ? ? ?EKG ? ? ?Radiology ?No results found. ? ?Procedures ?Procedures (including critical care time) ? ?Medications Ordered in UC ?Medications - No data to display ? ?Initial Impression / Assessment and Plan / UC Course  ?I have reviewed the triage vital signs and the nursing notes. ? ?Pertinent labs & imaging results that were available during my care of the patient were reviewed by me and considered in my medical decision making (see chart for details). ? ?  ? ?This patient is a very pleasant 21 y.o. year old female presenting with polyuria without polydipsia. Afebrile, nontachycardic, no reproducible abd pain or CVAT. ? ?Fasting CBG 92.  ?Last unprotected intercourse about 3 weeks ago. U-preg negative.  ?UA wnl, did not send culture . ?Denies vaginal symptoms, declines vaginitis panel.  ? ?Reassurance provided. ED return precautions discussed. Patient verbalizes understanding and agreement.  ? ? ?Final Clinical Impressions(s) / UC Diagnoses  ? ?Final diagnoses:  ?Urinary frequency  ? ? ? ?Discharge Instructions   ? ?  ?-Follow-up if symptoms worsen - abd pain, new fevers, new vaginal discharge, etc ?-You can take another pregnancy test in about 1 week at home  ? ? ?ED Prescriptions   ?None ?  ? ?PDMP not reviewed this encounter. ?  ?Rhys MartiniGraham, Ardie Mclennan E, PA-C ?07/07/21 1241 ? ?

## 2021-07-07 NOTE — ED Triage Notes (Signed)
Patient presents to Urgent Care with complaints of frequent urinatation and 38 days late on menstrual cycle.  ?

## 2021-07-07 NOTE — Discharge Instructions (Addendum)
-  Follow-up if symptoms worsen - abd pain, new fevers, new vaginal discharge, etc ?-You can take another pregnancy test in about 1 week at home  ?

## 2021-08-02 ENCOUNTER — Encounter: Payer: Self-pay | Admitting: Nurse Practitioner

## 2021-08-20 ENCOUNTER — Ambulatory Visit
Admission: RE | Admit: 2021-08-20 | Discharge: 2021-08-20 | Disposition: A | Discharge status: Home / Self Care | Payer: Medicaid Other | Source: Ambulatory Visit | Attending: Urgent Care | Admitting: Urgent Care

## 2021-08-20 ENCOUNTER — Ambulatory Visit: Payer: Medicaid Other

## 2021-08-20 VITALS — BP 121/78 | HR 92 | Temp 99.0°F | Resp 18

## 2021-08-20 DIAGNOSIS — N3001 Acute cystitis with hematuria: Secondary | ICD-10-CM | POA: Insufficient documentation

## 2021-08-20 DIAGNOSIS — R300 Dysuria: Secondary | ICD-10-CM | POA: Diagnosis present

## 2021-08-20 LAB — POCT URINALYSIS DIP (MANUAL ENTRY)
Bilirubin, UA: NEGATIVE
Glucose, UA: NEGATIVE mg/dL
Ketones, POC UA: NEGATIVE mg/dL
Nitrite, UA: POSITIVE — AB
Protein Ur, POC: >=300 mg/dL — AB
Spec Grav, UA: 1.025 (ref 1.010–1.025)
Urobilinogen, UA: 1 E.U./dL
pH, UA: 7 (ref 5.0–8.0)

## 2021-08-20 LAB — POCT URINE PREGNANCY: Preg Test, Ur: NEGATIVE

## 2021-08-20 MED ORDER — NITROFURANTOIN MONOHYD MACRO 100 MG PO CAPS: 100.0000 mg | ORAL_CAPSULE | Freq: Two times a day (BID) | ORAL | 0 refills | Status: DC

## 2021-08-20 NOTE — ED Triage Notes (Signed)
Pt c/o discomfort with urination that began about 5 days ago. The patient has been taking AZO (last time was yesterday) and hydrating.

## 2021-08-22 LAB — CERVICOVAGINAL ANCILLARY ONLY
Chlamydia: NEGATIVE
Comment: NEGATIVE
Comment: NEGATIVE
Comment: NORMAL
Neisseria Gonorrhea: NEGATIVE
Trichomonas: NEGATIVE

## 2021-08-22 LAB — URINE CULTURE: Culture: >=100000 — AB

## 2021-09-03 ENCOUNTER — Encounter: Payer: Self-pay | Admitting: Emergency Medicine

## 2021-09-03 ENCOUNTER — Ambulatory Visit: Payer: Medicaid Other

## 2021-09-03 ENCOUNTER — Ambulatory Visit
Admission: EM | Admit: 2021-09-03 | Discharge: 2021-09-03 | Disposition: A | Discharge status: Home / Self Care | Payer: Medicaid Other | Attending: Urgent Care | Admitting: Urgent Care

## 2021-09-03 DIAGNOSIS — N390 Urinary tract infection, site not specified: Secondary | ICD-10-CM

## 2021-09-03 LAB — POCT URINALYSIS DIP (MANUAL ENTRY)
Bilirubin, UA: NEGATIVE
Glucose, UA: NEGATIVE mg/dL
Ketones, POC UA: NEGATIVE mg/dL
Nitrite, UA: NEGATIVE
Protein Ur, POC: 100 mg/dL — AB
Spec Grav, UA: 1.025 (ref 1.010–1.025)
Urobilinogen, UA: 0.2 E.U./dL
pH, UA: 7 (ref 5.0–8.0)

## 2021-09-03 MED ORDER — CEPHALEXIN 500 MG PO CAPS: 500.0000 mg | ORAL_CAPSULE | Freq: Two times a day (BID) | ORAL | 0 refills | Status: DC

## 2021-09-03 NOTE — Discharge Instructions (Signed)

## 2021-09-03 NOTE — ED Triage Notes (Signed)
Pt here with a recurrence of her UTI sxs (painful urination) x 3 days.

## 2021-09-03 NOTE — ED Provider Notes (Signed)
[name]   MRN: [id number] DOB: 2001-01-25  Subjective:   Jill Neal is a 21 y.o. female presenting for 3-day history of recurrent dysuria.  Patient was just treated for an urinary tract infection with Macrobid.  This was confirmed as the correct treatment on the urine culture.  No current facility-administered medications for this encounter.  Current Outpatient Medications:    ibuprofen (ADVIL) 600 MG tablet, Take 1 tablet (600 mg total) by mouth every 6 (six) hours as needed., Disp: 30 tablet, Rfl: 0   nitrofurantoin, macrocrystal-monohydrate, (MACROBID) 100 MG capsule, Take 1 capsule (100 mg total) by mouth 2 (two) times daily., Disp: 10 capsule, Rfl: 0   Probiotic Product (UP4 PROBIOTICS PO), Take by mouth., Disp: , Rfl:    No Known Allergies  Past Medical History:  Diagnosis Date   Anemia    Anemia in pregnancy, third trimester 03/06/2019   Hgb 9.8 at 28 week visit Rx for Ferrous Sulfate ordered   Chlamydia    Post-dates pregnancy 05/29/2019   Pregnancy affected by fetal growth restriction 05/29/2019   Short cervical length during pregnancy in second trimester 01/20/2019   2.7 cm on 01/20/19   Supervision of low-risk pregnancy 11/12/2018   BABYSCRIPTS PATIENT: [x ]Initial [x ]12 [x20 [x] 28 [ ] 32 [ ] 36 [ ] 38 [ ] 39 [ ] 40  Nursing Staff Provider Office Location  [facility] Dating   LMP and 6.1 week Language   English Anatomy   Normal  Flu Vaccine  12/04/2018 Genetic Screen   NIPS: low risk  AFP:   negative  TDaP vaccine   03/05/19 Hgb A1C or  GTT Early 5.2 Third trimester 79-108-93 Rhogam   N/A   LAB RESULTS  Feeding Plan  Bottle Bloo   UTI (lower urinary tract infection)      Past Surgical History:  Procedure Laterality Date   NO PAST SURGERIES      Family History  Problem Relation Age of Onset   Hypertension Other    Healthy Mother    Healthy Father     Social History   Tobacco Use   Smoking status: Never   Smokeless tobacco: Never   Vaping Use   Vaping Use: Former  Substance Use Topics   Alcohol use: Not Currently   Drug use: Not Currently    Types: Marijuana    Comment: last used July    ROS   Objective:   Vitals: BP 104/73   Pulse 89   Temp 98.6 F (37 C)   Resp 18   LMP 07/16/2021   SpO2 98%   Physical Exam Constitutional:      General: She is not in acute distress.    Appearance: Normal appearance. She is well-developed. She is not ill-appearing, toxic-appearing or diaphoretic.  HENT:     Head: Normocephalic and atraumatic.     Nose: Nose normal.     Mouth/Throat:     Mouth: Mucous membranes are moist.  Eyes:     General: No scleral icterus.       Right eye: No discharge.        Left eye: No discharge.     Extraocular Movements: Extraocular movements intact.     Conjunctiva/sclera: Conjunctivae normal.  Cardiovascular:     Rate and Rhythm: Normal rate.  Pulmonary:     Effort: Pulmonary effort is normal.  Abdominal:     General: Bowel sounds are normal. There is no distension.     Palpations:  Abdomen is soft. There is no mass.     Tenderness: There is no abdominal tenderness. There is no right CVA tenderness, left CVA tenderness, guarding or rebound.  Skin:    General: Skin is warm and dry.  Neurological:     General: No focal deficit present.     Mental Status: She is alert and oriented to person, place, and time.  Psychiatric:        Mood and Affect: Mood normal.        Behavior: Behavior normal.        Thought Content: Thought content normal.        Judgment: Judgment normal.     No results found for this or any previous visit (from the past 24 hour(s)).  Assessment and Plan :   PDMP not reviewed this encounter.  No diagnosis found.

## 2021-09-06 LAB — URINE CULTURE: Culture: >=100000 — AB

## 2021-09-08 ENCOUNTER — Encounter (HOSPITAL_COMMUNITY): Payer: Self-pay

## 2021-09-10 ENCOUNTER — Telehealth: Payer: Self-pay

## 2021-09-10 NOTE — Telephone Encounter (Signed)
Patient verification complete (name and date of birth).    Patient called about still having urinary symptoms after the completion of abx. The patient was made aware that she is welcome to come for re-evaluation per provider. Patient verbalizes understanding. All questions answered.

## 2021-09-11 ENCOUNTER — Ambulatory Visit
Admission: RE | Admit: 2021-09-11 | Discharge: 2021-09-11 | Disposition: A | Discharge status: Home / Self Care | Payer: Medicaid Other | Source: Ambulatory Visit | Attending: Emergency Medicine | Admitting: Emergency Medicine

## 2021-09-11 VITALS — BP 113/75 | HR 78 | Temp 99.1°F | Resp 16

## 2021-09-11 DIAGNOSIS — N3000 Acute cystitis without hematuria: Secondary | ICD-10-CM | POA: Diagnosis present

## 2021-09-11 LAB — POCT URINALYSIS DIP (MANUAL ENTRY)
Bilirubin, UA: NEGATIVE
Blood, UA: NEGATIVE
Glucose, UA: NEGATIVE mg/dL
Ketones, POC UA: NEGATIVE mg/dL
Nitrite, UA: NEGATIVE
Spec Grav, UA: >=1.03 — AB (ref 1.010–1.025)
Urobilinogen, UA: 0.2 E.U./dL
pH, UA: 6 (ref 5.0–8.0)

## 2021-09-11 LAB — POCT URINE PREGNANCY: Preg Test, Ur: NEGATIVE

## 2021-09-11 MED ORDER — CEFTRIAXONE SODIUM 1 G IJ SOLR
1.0000 g | Freq: Once | INTRAMUSCULAR | Status: AC
  Administered 2021-09-11: 1 g via INTRAMUSCULAR

## 2021-09-11 MED ORDER — CIPROFLOXACIN HCL 500 MG PO TABS: 500.0000 mg | ORAL_TABLET | Freq: Two times a day (BID) | ORAL | 0 refills | Status: DC

## 2021-09-11 MED ORDER — FLUCONAZOLE 150 MG PO TABS: ORAL_TABLET | ORAL | 0 refills | Status: DC

## 2021-09-11 NOTE — ED Triage Notes (Addendum)
The pt states her previous UTI symptoms never resolved (lower back pain, painful urination and urinary frequency). The patient states she did take AZO 2 days ago.

## 2021-09-11 NOTE — ED Provider Notes (Signed)
[facility]    CSN: [id number] Arrival date & time: 09/11/21  1525    HISTORY   Chief Complaint  Patient presents with   Urinary Frequency    Entered by patient   HPI Jill Neal is a pleasant, 21 y.o. female who presents to urgent care today. Patient was seen here urgent care on July 24 for symptoms of UTI, she was diagnosed with acute cystitis and provided prescription for Macrobid, urinalysis revealed nitrites and urine culture revealed greater than 100,000 CFU's of E. coli.  Patient returned to urgent care on July 8 with same complaint, patient provided prescription for Keflex, urinalysis did not reveal nitrites and urine culture again revealed greater than 100,000 CFU's of E. coli.  Patient presents to urgent care today with the same complaint stating her symptoms still have not resolved.  Urinalysis today also did not reveal nitrites.  Patient has a slightly elevated temperature on arrival, vital signs are otherwise normal and patient appears to be in no acute distress.  The history is provided by the patient.   Past Medical History:  Diagnosis Date   Anemia    Anemia in pregnancy, third trimester 03/06/2019   Hgb 9.8 at 28 week visit Rx for Ferrous Sulfate ordered   Chlamydia    Post-dates pregnancy 05/29/2019   Pregnancy affected by fetal growth restriction 05/29/2019   Short cervical length during pregnancy in second trimester 01/20/2019   2.7 cm on 01/20/19   Supervision of low-risk pregnancy 11/12/2018   BABYSCRIPTS PATIENT: [x ]Initial [x ]12 [x20 [x] 28 [ ] 32 [ ] 36 [ ] 38 [ ] 39 [ ] 40  Nursing Staff Provider Office Location  [facility] Dating   LMP and 6.1 week [place] Language   English Anatomy US   Normal  Flu Vaccine  12/04/2018 Genetic Screen   NIPS: low risk  AFP:   negative  TDaP vaccine   03/05/19 Hgb A1C or  GTT Early 5.2 Third trimester 79-108-93 Rhogam   N/A   LAB RESULTS  Feeding Plan  Bottle Bloo   UTI (lower urinary tract infection)    Patient Active Problem  List   Diagnosis Date Noted   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 Authorizing Provider  ciprofloxacin (CIPRO) 500 MG tablet Take 1 tablet (500 mg total) by mouth every 12 (twelve) hours for 7 days. 09/11/21 09/18/21 Yes Lynden Oxford Scales, PA-C  fluconazole (DIFLUCAN) 150 MG tablet Take 1 tablet on day 4 of antibiotics.  Take second tablet 3 days later. 09/11/21  Yes Lynden Oxford Scales, PA-C  ibuprofen (ADVIL) 600 MG tablet Take 1 tablet (600 mg total) by mouth every 6 (six) hours as needed. 03/08/20   LampteyMyrene Galas, MD  Probiotic Product (UP4 PROBIOTICS PO) Take by mouth.    [provider]  ferrous sulfate 325 (65 FE) MG tablet Take  1 tablet (325 mg total) by mouth 3 (three) times daily with meals. Patient not taking: No sig reported 03/10/19 03/08/20  Levie Heritage, DO  montelukast (SINGULAIR) 10 MG tablet Take 1 tablet (10 mg total) by mouth at bedtime. Patient not taking: Reported on 10/03/2018 11/22/17 10/03/18  Elvina Sidle, MD    Family History Family History  Problem Relation Age of Onset   Hypertension Other    Healthy Mother    Healthy Father    Social History Social History   Tobacco Use   Smoking status: Never   Smokeless tobacco: Never  Vaping Use   Vaping Use: Former  Substance Use Topics   Alcohol use: Not Currently   Drug use: Not Currently    Types: Marijuana    Comment: last used July   Allergies   Patient has no known allergies.  Review of Systems Review of Systems Pertinent findings revealed after performing a 14 point review of systems has been noted  in the history of present illness.  Physical Exam Triage Vital Signs ED Triage Vitals  Enc Vitals Group     BP 12/24/20 0827 (!) 147/82     Pulse Rate 12/24/20 0827 72     Resp 12/24/20 0827 18     Temp 12/24/20 0827 98.3 F (36.8 C)     Temp Source 12/24/20 0827 Oral     SpO2 12/24/20 0827 98 %     Weight --      Height --      Head Circumference --      Peak Flow --      Pain Score 12/24/20 0826 5     Pain Loc --      Pain Edu? --      Excl. in GC? --   No data found.  Updated Vital Signs BP 113/75 (BP Location: Right Arm)   Pulse 78   Temp 99.1 F (37.3 C) (Oral)   Resp 16   LMP 08/20/2021   SpO2 97%   Physical Exam Vitals and nursing note reviewed.  Constitutional:      General: She is not in acute distress.    Appearance: Normal appearance. She is not ill-appearing.  HENT:     Head: Normocephalic and atraumatic.  Eyes:     General: Lids are normal.        Right eye: No discharge.        Left eye: No discharge.     Extraocular Movements: Extraocular movements intact.     Conjunctiva/sclera: Conjunctivae normal.     Right eye: Right conjunctiva is not injected.     Left eye: Left conjunctiva is not injected.  Neck:     Trachea: Trachea and phonation normal.  Cardiovascular:     Rate and Rhythm: Normal rate and regular rhythm.     Pulses: Normal pulses.     Heart sounds: Normal heart sounds. No murmur heard.    No friction rub. No gallop.  Pulmonary:     Effort: Pulmonary effort is normal. No accessory muscle usage, prolonged expiration or respiratory distress.     Breath sounds: Normal breath sounds. No stridor, decreased air movement or transmitted upper airway sounds. No decreased breath sounds, wheezing, rhonchi or rales.  Chest:     Chest wall: No tenderness.  Abdominal:     General: Abdomen is flat. Bowel sounds are normal. There is no distension.     Palpations: Abdomen is soft.     Tenderness: There  is abdominal tenderness in the suprapubic  area. There is no right CVA tenderness or left CVA tenderness.     Hernia: No hernia is present.  Musculoskeletal:        General: Normal range of motion.     Cervical back: Normal range of motion and neck supple. Normal range of motion.  Lymphadenopathy:     Cervical: No cervical adenopathy.  Skin:    General: Skin is warm and dry.     Findings: No erythema or rash.  Neurological:     General: No focal deficit present.     Mental Status: She is alert and oriented to person, place, and time.  Psychiatric:        Mood and Affect: Mood normal.        Behavior: Behavior normal.     Visual Acuity Right Eye Distance:   Left Eye Distance:   Bilateral Distance:    Right Eye Near:   Left Eye Near:    Bilateral Near:     [facility] Couse / Diagnostics / Procedures:     Radiology No results found.  Procedures Procedures (including critical care time) EKG  Pending results:  Labs Reviewed  POCT URINALYSIS DIP (MANUAL ENTRY) - Abnormal; Notable for the following components:      Result Value   Spec Grav, UA >=1.030 (*)    Protein Ur, POC trace (*)    Leukocytes, UA Trace (*)    All other components within normal limits  URINE CULTURE  POCT URINE PREGNANCY  CERVICOVAGINAL ANCILLARY ONLY    Medications Ordered in UC: Medications  cefTRIAXone (ROCEPHIN) injection 1 g (1 g Intramuscular Given 09/11/21 1620)    UC Diagnoses / Final Clinical Impressions(s)   I have reviewed the triage vital signs and the nursing notes.  Pertinent labs & imaging results that were available during my care of the patient were reviewed by me and considered in my medical decision making (see chart for details).    Final diagnoses:  Acute cystitis without hematuria   Patient presents with signs and symptoms of recurrent urinary tract infection that is due to E. coli unresolved with 2 rounds of antibiotics.  Patient provided with an injection of ceftriaxone for parenteral treatment and a 7-day course of  ciprofloxacin.  Patient provided with fluconazole for the inevitable vaginal yeast infection caused by prolonged use of antibiotics.  Urine culture pending, will notify patient of results once received.  Return precautions advised.  ED Prescriptions     Medication Sig Dispense Auth. Provider   ciprofloxacin (CIPRO) 500 MG tablet Take 1 tablet (500 mg total) by mouth every 12 (twelve) hours for 7 days. 14 tablet Lynden Oxford Scales, PA-C   fluconazole (DIFLUCAN) 150 MG tablet Take 1 tablet on day 4 of antibiotics.  Take second tablet 3 days later. 2 tablet Lynden Oxford Scales, PA-C      PDMP not reviewed this encounter.  Disposition Upon Discharge:  Condition: stable for discharge home  Patient presented with concern for an acute illness with associated systemic symptoms and significant discomfort requiring urgent management. In my opinion, this is a condition that a prudent lay person (someone who possesses an average knowledge of health and medicine) may potentially expect to result in complications if not addressed urgently such as respiratory distress, impairment of bodily function or dysfunction of bodily organs.   As such, the patient has been evaluated and assessed, work-up was performed and treatment was provided in alignment with urgent care  protocols and evidence based medicine.  Patient/parent/caregiver has been advised that the patient may require follow up for further testing and/or treatment if the symptoms continue in spite of treatment, as clinically indicated and appropriate.  Routine symptom specific, illness specific and/or disease specific instructions were discussed with the patient and/or caregiver at length.  Prevention strategies for avoiding STD exposure were also discussed.  The patient will follow up with their current PCP if and as advised. If the patient does not currently have a PCP we will assist them in obtaining one.   The patient may need specialty follow  up if the symptoms continue, in spite of conservative treatment and management, for further workup, evaluation, consultation and treatment as clinically indicated and appropriate.  Patient/parent/caregiver verbalized understanding and agreement of plan as discussed.  All questions were addressed during visit.  Please see discharge instructions below for further details of plan.  Discharge Instructions:   Discharge Instructions      The urinalysis that we performed in the clinic today was abnormal.  Urine culture will be performed per our protocol.  The result of the urine culture will be available in the next 3 to 5 days and will be posted to your MyChart account.  If there is an abnormal finding, you will be contacted by phone and advised of further treatment recommendations, if any.   You were advised to begin antibiotics today because you continue to have active symptoms of a urinary tract infection despite taking 2 rounds of antibiotics.  It is very important that you take all doses exactly as prescribed.  Incomplete antibiotic therapy can cause worsening urinary tract infection that can become aggressive, reach the level of your kidneys causing kidney infection and possible hospitalization.  The treatment that I recommend will specifically treat E. coli which was the bacteria isolated in both urine cultures that we performed.  You received an injection of ceftriaxone 1 g during your visit today.  You also have a prescription for ciprofloxacin 500 mg at your pharmacy.  Please take 1 tablet twice daily for the next 7 days.   Because we know that antibiotic treatment can often cause vaginal yeast infections, I have also provided you with a prescription for fluconazole (Diflucan).  Please take the first tablet on the third day of your antibiotics and take the second tablet two days after the first tablet.  Antibiotics can also cause diarrhea.  The diarrhea will resolve once you complete taking  antibiotics.  If you become uncomfortable due to the diarrhea, please consider purchasing Imodium and taking 1 to 2 tablets 4 times daily as needed.   If you have not had complete resolution of your symptoms of urinary tract infection after completing treatment as prescribed, please return to urgent care for repeat evaluation or follow-up with your primary care provider.   Thank you for visiting urgent care today.  I appreciate the opportunity to participate in your care.     This office note has been dictated using Teaching laboratory technician.  Unfortunately, this method of dictation can sometimes lead to typographical or grammatical errors.  I apologize for your inconvenience in advance if this occurs.  Please do not hesitate to reach out to me if clarification is needed.       Theadora Rama Scales, [place] 09/12/21 [phone]

## 2021-09-11 NOTE — Discharge Instructions (Signed)
The urinalysis that we performed in the clinic today was abnormal.  Urine culture will be performed per our protocol.  The result of the urine culture will be available in the next 3 to 5 days and will be posted to your MyChart account.  If there is an abnormal finding, you will be contacted by phone and advised of further treatment recommendations, if any.   You were advised to begin antibiotics today because you continue to have active symptoms of a urinary tract infection despite taking 2 rounds of antibiotics.  It is very important that you take all doses exactly as prescribed.  Incomplete antibiotic therapy can cause worsening urinary tract infection that can become aggressive, reach the level of your kidneys causing kidney infection and possible hospitalization.  The treatment that I recommend will specifically treat E. coli which was the bacteria isolated in both urine cultures that we performed.  You received an injection of ceftriaxone 1 g during your visit today.  You also have a prescription for ciprofloxacin 500 mg at your pharmacy.  Please take 1 tablet twice daily for the next 7 days.   Because we know that antibiotic treatment can often cause vaginal yeast infections, I have also provided you with a prescription for fluconazole (Diflucan).  Please take the first tablet on the third day of your antibiotics and take the second tablet two days after the first tablet.  Antibiotics can also cause diarrhea.  The diarrhea will resolve once you complete taking antibiotics.  If you become uncomfortable due to the diarrhea, please consider purchasing Imodium and taking 1 to 2 tablets 4 times daily as needed.   If you have not had complete resolution of your symptoms of urinary tract infection after completing treatment as prescribed, please return to urgent care for repeat evaluation or follow-up with your primary care provider.   Thank you for visiting urgent care today.  I appreciate the  opportunity to participate in your care.

## 2021-09-13 LAB — CERVICOVAGINAL ANCILLARY ONLY
Bacterial Vaginitis (gardnerella): NEGATIVE
Candida Glabrata: NEGATIVE
Candida Vaginitis: NEGATIVE
Chlamydia: NEGATIVE
Comment: NEGATIVE
Comment: NEGATIVE
Comment: NEGATIVE
Comment: NEGATIVE
Comment: NEGATIVE
Comment: NORMAL
Neisseria Gonorrhea: NEGATIVE
Trichomonas: NEGATIVE

## 2021-09-13 LAB — URINE CULTURE

## 2021-09-14 ENCOUNTER — Telehealth: Payer: Self-pay | Admitting: Emergency Medicine

## 2021-09-14 MED ORDER — SULFAMETHOXAZOLE-TRIMETHOPRIM 800-160 MG PO TABS: 1.0000 | ORAL_TABLET | Freq: Two times a day (BID) | ORAL | 0 refills | Status: AC

## 2021-09-14 NOTE — Telephone Encounter (Signed)
Patient was provided with a 7-day course of ciprofloxacin on September 11, 2021 for recurrent urinary tract infection, most recent urine culture revealing E. coli.  Patient had previously been treated with Macrobid and then Keflex without complete resolution of symptoms.  Patient contacted clinic by phone on September 14, 2021 to report nausea with ciprofloxacin and requested a different antibiotic as she could not tolerate the ciprofloxacin.  RN reached out to prescribing provider for recommendations.  Per provider, patient was advised that while ciprofloxacin is superior to Bactrim, if she is unable to tolerate that then we will switch to Bactrim for coverage of E. coli.  Prescription was sent to her pharmacy.  Patient was advised of urine culture result as well which was inconclusive and that the laboratory report suggested recollection.  Also per provider, patient was further advised that because she has been antibiotics for 3 days at this point, do not recommend recollection and that she continues to have symptoms after completing 5 days of Bactrim.    Per RN, patient verbalized understanding and agreement with this plan.

## 2021-09-14 NOTE — Telephone Encounter (Signed)
Patient verification complete (name and date of birth).    Patient phone call returned, the patient requesting to be placed on bactrim as her and the provider discussed on her visit due to having nausea. The provider sent in the new medication. The patient was made aware of how to take the medication. The patient was also made aware that her urine culture was inconclusive as there were many species present and that recollection was recommended. The provider stated at this point, she do not recommend this just because the pt has been on Cipro for a few days but if she continues to have symptoms then she is welcome to come back fore re-evaluation. All patient questions answered.

## 2021-10-16 ENCOUNTER — Ambulatory Visit
Admission: RE | Admit: 2021-10-16 | Discharge: 2021-10-16 | Disposition: A | Discharge status: Home / Self Care | Payer: Medicaid Other | Source: Ambulatory Visit | Attending: Urgent Care | Admitting: Urgent Care

## 2021-10-16 VITALS — BP 115/81 | HR 81 | Temp 98.5°F | Resp 20

## 2021-10-16 DIAGNOSIS — R300 Dysuria: Secondary | ICD-10-CM | POA: Insufficient documentation

## 2021-10-16 DIAGNOSIS — R350 Frequency of micturition: Secondary | ICD-10-CM | POA: Diagnosis present

## 2021-10-16 DIAGNOSIS — N898 Other specified noninflammatory disorders of vagina: Secondary | ICD-10-CM | POA: Insufficient documentation

## 2021-10-16 LAB — POCT URINALYSIS DIP (MANUAL ENTRY)
Bilirubin, UA: NEGATIVE
Blood, UA: NEGATIVE
Glucose, UA: NEGATIVE mg/dL
Ketones, POC UA: NEGATIVE mg/dL
Leukocytes, UA: NEGATIVE
Nitrite, UA: NEGATIVE
Protein Ur, POC: NEGATIVE mg/dL
Spec Grav, UA: 1.025 (ref 1.010–1.025)
Urobilinogen, UA: 0.2 E.U./dL
pH, UA: 6.5 (ref 5.0–8.0)

## 2021-10-16 LAB — POCT URINE PREGNANCY: Preg Test, Ur: NEGATIVE

## 2021-10-16 NOTE — ED Triage Notes (Signed)
Pt here with recurrent UTI sxs. No discharge. Has some concern for STIs. Pt has increased urgency, frequency and some abdominal pressure.

## 2021-10-16 NOTE — ED Provider Notes (Signed)
[name]   MRN: [id number] DOB: [birth year]  Subjective:   Jill Neal is a 21 y.o. female presenting for recurrent urinary symptoms.  Patient has had 2 recent positive urine cultures from 08/20/2021 and 09/03/2021.  Both showed an E. coli infection.  Sensitivities showed no resistance.  Urine culture from 09/11/2021 was equivocal.  Today, she reports that she primarily feels urinary frequency and urgency, slight vaginal itching.  Would like to be checked for sexually transmitted infections.  No current facility-administered medications for this encounter.  Current Outpatient Medications:    fluconazole (DIFLUCAN) 150 MG tablet, Take 1 tablet on day 4 of antibiotics.  Take second tablet 3 days later., Disp: 2 tablet, Rfl: 0   ibuprofen (ADVIL) 600 MG tablet, Take 1 tablet (600 mg total) by mouth every 6 (six) hours as needed., Disp: 30 tablet, Rfl: 0   Probiotic Product (UP4 PROBIOTICS PO), Take by mouth., Disp: , Rfl:    Allergies  Allergen Reactions   Ciprofloxacin Nausea Only    Past Medical History:  Diagnosis Date   Anemia    Anemia in pregnancy, third trimester 03/06/2019   Hgb 9.8 at 28 week visit Rx for Ferrous Sulfate ordered   Chlamydia    Post-dates pregnancy 05/29/2019   Pregnancy affected by fetal growth restriction 05/29/2019   Short cervical length during pregnancy in second trimester 01/20/2019   2.7 cm on 01/20/19   Supervision of low-risk pregnancy 11/12/2018   BABYSCRIPTS PATIENT: [x ]Initial [x ]12 [x20 [x] 28 [ ] 32 [ ] 36 [ ] 38 [ ] 39 [ ] 40  Nursing Staff Provider Office Location  [facility] Dating   LMP and 6.1 week Language   English Anatomy   Normal  Flu Vaccine  12/04/2018 Genetic Screen   NIPS: low risk  AFP:   negative  TDaP vaccine   03/05/19 Hgb A1C or  GTT Early 5.2 Third trimester 79-108-93 Rhogam   N/A   LAB RESULTS  Feeding Plan  Bottle Bloo   UTI (lower urinary tract infection)      Past Surgical History:  Procedure  Laterality Date   NO PAST SURGERIES      Family History  Problem Relation Age of Onset   Hypertension Other    Healthy Mother    Healthy Father     Social History   Tobacco Use   Smoking status: Never   Smokeless tobacco: Never  Vaping Use   Vaping Use: Former  Substance Use Topics   Alcohol use: Not Currently   Drug use: Not Currently    Types: Marijuana    Comment: last used July    ROS   Objective:   Vitals: BP 115/81   Pulse 81   Temp 98.5 F (36.9 C)   Resp 20   SpO2 98%   Physical Exam Constitutional:      General: She is not in acute distress.    Appearance: Normal appearance. She is well-developed. She is not ill-appearing, toxic-appearing or diaphoretic.  HENT:     Head: Normocephalic and atraumatic.     Nose: Nose normal.     Mouth/Throat:     Mouth: Mucous membranes are moist.  Eyes:     General: No scleral icterus.       Right eye: No discharge.        Left eye: No discharge.     Extraocular Movements: Extraocular movements intact.     Conjunctiva/sclera: Conjunctivae normal.  Cardiovascular:  Rate and Rhythm: Normal rate.  Pulmonary:     Effort: Pulmonary effort is normal.  Abdominal:     General: Bowel sounds are normal. There is no distension.     Palpations: Abdomen is soft. There is no mass.     Tenderness: There is no abdominal tenderness. There is no right CVA tenderness, left CVA tenderness, guarding or rebound.  Skin:    General: Skin is warm and dry.  Neurological:     General: No focal deficit present.     Mental Status: She is alert and oriented to person, place, and time.  Psychiatric:        Mood and Affect: Mood normal.        Behavior: Behavior normal.        Thought Content: Thought content normal.        Judgment: Judgment normal.     Results for orders placed or performed during the hospital encounter of 10/16/21 (from the past 24 hour(s))  POCT urine pregnancy     Status: None   Collection Time: 10/16/21   9:16 AM  Result Value Ref Range   Preg Test, Ur Negative Negative  POCT urinalysis dipstick     Status: None   Collection Time: 10/16/21  9:16 AM  Result Value Ref Range   Color, UA yellow yellow   Clarity, UA clear clear   Glucose, UA negative negative mg/dL   Bilirubin, UA negative negative   Ketones, POC UA negative negative mg/dL   Spec Grav, UA 0.160 1.093 - 1.025   Blood, UA negative negative   pH, UA 6.5 5.0 - 8.0   Protein Ur, POC negative negative mg/dL   Urobilinogen, UA 0.2 0.2 or 1.0 E.U./dL   Nitrite, UA Negative Negative   Leukocytes, UA Negative Negative    Assessment and Plan :   PDMP not reviewed this encounter.  1. Urinary frequency   2. Dysuria   3. Vaginal itching    Recommended that she set up a consultation with [facility].  I advised that she avoid all urinary irritants which she is trying to do but has a soda a few times a week.  She has a leftover fluconazole which she plans on using today.  I am not opposed to this.  Recommended treatment based off of results otherwise. Counseled patient on potential for adverse effects with medications prescribed/recommended today, ER and return-to-clinic precautions discussed, patient verbalized understanding.    Wallis Bamberg, [place] 10/16/21 [phone]

## 2021-10-16 NOTE — Discharge Instructions (Addendum)
Make sure you hydrate very well with plain water and a quantity of 80 ounces of water a day.  Please limit drinks that are considered urinary irritants such as soda, sweet tea, coffee, energy drinks, alcohol.  These can worsen your urinary and genital symptoms but also be the source of them.  I will let you know about your urine culture results through MyChart to see if we need to prescribe or change your antibiotics based off of those results.  

## 2021-10-17 LAB — CERVICOVAGINAL ANCILLARY ONLY
Bacterial Vaginitis (gardnerella): NEGATIVE
Candida Glabrata: NEGATIVE
Candida Vaginitis: POSITIVE — AB
Chlamydia: NEGATIVE
Comment: NEGATIVE
Comment: NEGATIVE
Comment: NEGATIVE
Comment: NEGATIVE
Comment: NEGATIVE
Comment: NORMAL
Neisseria Gonorrhea: NEGATIVE
Trichomonas: NEGATIVE

## 2021-10-17 LAB — URINE CULTURE

## 2021-10-18 ENCOUNTER — Telehealth (HOSPITAL_COMMUNITY): Payer: Self-pay | Admitting: Emergency Medicine

## 2021-10-18 MED ORDER — FLUCONAZOLE 150 MG PO TABS: 150.0000 mg | ORAL_TABLET | Freq: Once | ORAL | 0 refills | Status: AC

## 2021-10-27 ENCOUNTER — Ambulatory Visit: Payer: Medicaid Other | Admitting: Nurse Practitioner

## 2021-11-02 ENCOUNTER — Ambulatory Visit
Admission: EM | Admit: 2021-11-02 | Discharge: 2021-11-02 | Disposition: A | Discharge status: Home / Self Care | Payer: Medicaid Other | Attending: Urgent Care | Admitting: Urgent Care

## 2021-11-02 ENCOUNTER — Encounter: Payer: Self-pay | Admitting: Emergency Medicine

## 2021-11-02 DIAGNOSIS — N760 Acute vaginitis: Secondary | ICD-10-CM | POA: Insufficient documentation

## 2021-11-02 DIAGNOSIS — Z3202 Encounter for pregnancy test, result negative: Secondary | ICD-10-CM

## 2021-11-02 DIAGNOSIS — B3731 Acute candidiasis of vulva and vagina: Secondary | ICD-10-CM | POA: Insufficient documentation

## 2021-11-02 DIAGNOSIS — N898 Other specified noninflammatory disorders of vagina: Secondary | ICD-10-CM

## 2021-11-02 LAB — POCT URINE PREGNANCY: Preg Test, Ur: NEGATIVE

## 2021-11-02 NOTE — ED Triage Notes (Signed)
Pt here with nausea 5 days and vaginal irritation and discharge with foul odor x 3 days.

## 2021-11-02 NOTE — ED Provider Notes (Signed)
[name]  Note:  This document was prepared using Conservation officer, historic buildings and may include unintentional dictation errors.  MRN: [id number] DOB: 2000-06-17  Subjective:   Jill Neal is a 21 y.o. female presenting for 5 day history of acute onset recurrent diffuse vaginal discharge with malodor and vaginal itching.  Patient would like a pregnancy test.  No fever, nausea, vomiting, abdominal or pelvic pain.  She has leftover fluconazole from her last episode of vaginitis.  Has not taken it.  Wants STI testing through the vaginal swab.  Prefers to wait for results to get treatment.  No current facility-administered medications for this encounter.  Current Outpatient Medications:    ibuprofen (ADVIL) 600 MG tablet, Take 1 tablet (600 mg total) by mouth every 6 (six) hours as needed., Disp: 30 tablet, Rfl: 0   Probiotic Product (UP4 PROBIOTICS PO), Take by mouth., Disp: , Rfl:    Allergies  Allergen Reactions   Ciprofloxacin Nausea Only    Past Medical History:  Diagnosis Date   Anemia    Anemia in pregnancy, third trimester 03/06/2019   Hgb 9.8 at 28 week visit Rx for Ferrous Sulfate ordered   Chlamydia    Post-dates pregnancy 05/29/2019   Pregnancy affected by fetal growth restriction 05/29/2019   Short cervical length during pregnancy in second trimester 01/20/2019   2.7 cm on 01/20/19   Supervision of low-risk pregnancy 11/12/2018   BABYSCRIPTS PATIENT: [x ]Initial [x ]12 [x20 [x] 28 [ ] 32 [ ] 36 [ ] 38 [ ] 39 [ ] 40  Nursing Staff Provider Office Location  [facility] Dating   LMP and 6.1 week Language   English Anatomy   Normal  Flu Vaccine  12/04/2018 Genetic Screen   NIPS: low risk  AFP:   negative  TDaP vaccine   03/05/19 Hgb A1C or  GTT Early 5.2 Third trimester 79-108-93 Rhogam   N/A   LAB RESULTS  Feeding Plan  Bottle Bloo   UTI (lower urinary tract infection)      Past Surgical History:  Procedure Laterality Date   NO PAST SURGERIES       Family History  Problem Relation Age of Onset   Hypertension Other    Healthy Mother    Healthy Father     Social History   Tobacco Use   Smoking status: Never   Smokeless tobacco: Never  Vaping Use   Vaping Use: Former  Substance Use Topics   Alcohol use: Not Currently   Drug use: Not Currently    Types: Marijuana    Comment: last used July    ROS   Objective:   Vitals: BP 126/78   Pulse (!) 112   Temp 98.6 F (37 C)   Resp 20   LMP 09/21/2021 (Approximate)   SpO2 98%   Physical Exam Constitutional:      General: She is not in acute distress.    Appearance: Normal appearance. She is well-developed. She is not ill-appearing, toxic-appearing or diaphoretic.  HENT:     Head: Normocephalic and atraumatic.     Nose: Nose normal.     Mouth/Throat:     Mouth: Mucous membranes are moist.     Pharynx: Oropharynx is clear.  Eyes:     General: No scleral icterus.       Right eye: No discharge.        Left eye: No discharge.     Extraocular Movements: Extraocular movements intact.  Conjunctiva/sclera: Conjunctivae normal.  Cardiovascular:     Rate and Rhythm: Normal rate.  Pulmonary:     Effort: Pulmonary effort is normal.  Abdominal:     General: Bowel sounds are normal. There is no distension.     Palpations: Abdomen is soft. There is no mass.     Tenderness: There is no abdominal tenderness. There is no right CVA tenderness, left CVA tenderness, guarding or rebound.  Skin:    General: Skin is warm and dry.  Neurological:     General: No focal deficit present.     Mental Status: She is alert and oriented to person, place, and time.  Psychiatric:        Mood and Affect: Mood normal.        Behavior: Behavior normal.        Thought Content: Thought content normal.        Judgment: Judgment normal.     Results for orders placed or performed during the hospital encounter of 11/02/21 (from the past 24 hour(s))  POCT urine pregnancy     Status: None    Collection Time: 11/02/21  4:29 PM  Result Value Ref Range   Preg Test, Ur Negative Negative    Assessment and Plan :   PDMP not reviewed this encounter.  1. Acute vaginitis   2. Vaginal itching   3. Vaginal discharge     Advised that she take the fluconazole she already has. Vaginal swab pending. No urinalysis done as there are no urine symptoms. Negative pregnancy test. Counseled patient on potential for adverse effects with medications prescribed/recommended today, ER and return-to-clinic precautions discussed, patient verbalized understanding.    Wallis Bamberg, PA-C 11/02/21 1659

## 2021-11-03 ENCOUNTER — Telehealth (HOSPITAL_COMMUNITY): Payer: Self-pay | Admitting: Emergency Medicine

## 2021-11-03 LAB — CERVICOVAGINAL ANCILLARY ONLY
Bacterial Vaginitis (gardnerella): POSITIVE — AB
Candida Glabrata: NEGATIVE
Candida Vaginitis: POSITIVE — AB
Comment: NEGATIVE
Comment: NEGATIVE
Comment: NEGATIVE

## 2021-11-03 MED ORDER — METRONIDAZOLE 500 MG PO TABS: 500.0000 mg | ORAL_TABLET | Freq: Two times a day (BID) | ORAL | 0 refills | Status: DC

## 2021-11-07 ENCOUNTER — Telehealth: Payer: Self-pay | Admitting: Emergency Medicine

## 2021-11-07 MED ORDER — FLUCONAZOLE 150 MG PO TABS
150.0000 mg | ORAL_TABLET | ORAL | 0 refills | Status: DC
Start: 2021-11-07 — End: 2021-12-01

## 2021-11-17 ENCOUNTER — Ambulatory Visit: Payer: Medicaid Other

## 2021-11-22 ENCOUNTER — Ambulatory Visit: Payer: Medicaid Other

## 2021-11-29 ENCOUNTER — Ambulatory Visit
Admission: EM | Admit: 2021-11-29 | Discharge: 2021-11-29 | Disposition: A | Discharge status: Home / Self Care | Payer: Medicaid Other | Attending: Physician Assistant | Admitting: Physician Assistant

## 2021-11-29 ENCOUNTER — Encounter: Payer: Self-pay | Admitting: Emergency Medicine

## 2021-11-29 ENCOUNTER — Other Ambulatory Visit: Payer: Self-pay

## 2021-11-29 DIAGNOSIS — N912 Amenorrhea, unspecified: Secondary | ICD-10-CM | POA: Diagnosis present

## 2021-11-29 DIAGNOSIS — R300 Dysuria: Secondary | ICD-10-CM

## 2021-11-29 DIAGNOSIS — Z113 Encounter for screening for infections with a predominantly sexual mode of transmission: Secondary | ICD-10-CM | POA: Diagnosis present

## 2021-11-29 DIAGNOSIS — N760 Acute vaginitis: Secondary | ICD-10-CM

## 2021-11-29 LAB — POCT URINALYSIS DIP (MANUAL ENTRY)
Bilirubin, UA: NEGATIVE
Blood, UA: NEGATIVE
Glucose, UA: NEGATIVE mg/dL
Ketones, POC UA: NEGATIVE mg/dL
Nitrite, UA: NEGATIVE
Protein Ur, POC: NEGATIVE mg/dL
Spec Grav, UA: 1.02 (ref 1.010–1.025)
Urobilinogen, UA: 0.2 E.U./dL
pH, UA: 6 (ref 5.0–8.0)

## 2021-11-29 LAB — POCT URINE PREGNANCY: Preg Test, Ur: NEGATIVE

## 2021-11-29 NOTE — ED Provider Notes (Signed)
[name]    CSN: [id number] Arrival date & time: 11/29/21  1420      History   Chief Complaint Chief Complaint  Patient presents with   SEXUALLY TRANSMITTED DISEASE    HPI Jill Neal is a 21 y.o. female.   21 year old female presents for STI screening, vaginitis and pregnancy test.  Patient indicates that her last period was September 12 and was regular but heavy.  She relates that she is not on birth control and is sexually active.  She indicates that she just exited a relationship to where she was concerned about being exposed to possible STI infections because she has had recurrent BV and yeast infections.  She request to have a pregnancy test along with STI screening today.  She also indicates that when she was seen on 9/9 she was not able to finish the Flagyl due to stomach being upset.  She indicates she has continued to have intermittently vaginal discharge which she describes as white, thick, without odor.  She also does get some mild itching associated.  She also indicates that she has had some increased frequency with mild intermittent dysuria, and some mild lower back pain.  She denies any fever, chills, nausea or vomiting.  She is tolerating fluids well.     Past Medical History:  Diagnosis Date   Anemia    Anemia in pregnancy, third trimester 03/06/2019   Hgb 9.8 at 28 week visit Rx for Ferrous Sulfate ordered   Chlamydia    Post-dates pregnancy 05/29/2019   Pregnancy affected by fetal growth restriction 05/29/2019   Short cervical length during pregnancy in second trimester 01/20/2019   2.7 cm on 01/20/19   Supervision of low-risk pregnancy 11/12/2018   BABYSCRIPTS PATIENT: [x ]Initial [x ]12 [x20 [x] 28 [ ] 32 [ ] 36 [ ] 38 [ ] 39 [ ] 40  Nursing Staff Provider Office Location  [facility] Dating   LMP and 6.1 week [place] Language   English Anatomy [place]   Normal  Flu Vaccine  12/04/2018 Genetic Screen   NIPS: low risk  AFP:   negative  TDaP vaccine   03/05/19 Hgb A1C or   GTT Early 5.2 Third trimester 79-108-93 Rhogam   N/A   LAB RESULTS  Feeding Plan  Bottle Bloo   UTI (lower urinary tract infection)     Patient Active Problem List   Diagnosis Date Noted   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 Authorizing Provider  fluconazole (DIFLUCAN) 150 MG tablet Take 1 tablet (150 mg total) by mouth every 3 (three) days. Patient not taking: Reported on 11/29/2021 11/07/21   Chase Picket, MD  ibuprofen (ADVIL) 600 MG tablet Take 1 tablet (600 mg total) by mouth every 6 (six) hours as needed. 03/08/20   Chase Picket, MD  metroNIDAZOLE (FLAGYL) 500 MG tablet  Take 1 tablet (500 mg total) by mouth 2 (two) times daily. Patient not taking: Reported on 11/29/2021 11/03/21   Chase Picket, MD  Probiotic Product (UP4 PROBIOTICS PO) Take by mouth.    [provider]  ferrous sulfate 325 (65 FE) MG tablet Take 1 tablet (325 mg total) by mouth 3 (three) times daily with meals. Patient not taking: No sig reported 03/10/19 03/08/20  Truett Mainland, DO  montelukast (SINGULAIR) 10 MG tablet Take 1 tablet (10 mg total) by mouth at bedtime. Patient not taking: Reported on 10/03/2018 11/22/17 10/03/18  Robyn Haber, MD    Family History Family History  Problem Relation Age of Onset   Hypertension Other    Healthy Mother    Healthy Father     Social History Social History   Tobacco Use   Smoking status: Never   Smokeless tobacco: Never  Vaping Use   Vaping Use: Former  Substance Use Topics   Alcohol use: Not Currently   Drug use: Not Currently    Types: Marijuana     Comment: last used July     Allergies   Ciprofloxacin   Review of Systems Review of Systems  Genitourinary:  Positive for frequency and vaginal discharge (white and thick). Negative for dysuria and urgency.     Physical Exam Triage Vital Signs ED Triage Vitals  Enc Vitals Group     BP 11/29/21 1433 110/73     Pulse Rate 11/29/21 1433 82     Resp 11/29/21 1433 20     Temp 11/29/21 1433 98.4 F (36.9 C)     Temp Source 11/29/21 1433 Oral     SpO2 11/29/21 1433 97 %     Weight --      Height --      Head Circumference --      Peak Flow --      Pain Score 11/29/21 1430 4     Pain Loc --      Pain Edu? --      Excl. in [place]? --    No data found.  Updated Vital Signs BP 110/73 (BP Location: Left Arm) Comment (BP Location): large cuff  Pulse 82   Temp 98.4 F (36.9 C) (Oral)   Resp 20   LMP 11/08/2021 (Approximate)   SpO2 97%   Visual Acuity Right Eye Distance:   Left Eye Distance:   Bilateral Distance:    Right Eye Near:   Left Eye Near:    Bilateral Near:     Physical Exam Constitutional:      Appearance: Normal appearance.  Abdominal:     General: Abdomen is flat. Bowel sounds are normal.     Palpations: Abdomen is soft.     Tenderness: There is abdominal tenderness in the suprapubic area. There is no guarding or rebound.  Neurological:     Mental Status: She is alert.      UC Treatments / Results  Labs (all labs ordered are listed, but only abnormal results are displayed) Labs Reviewed  POCT URINALYSIS DIP (MANUAL ENTRY) - Abnormal; Notable for the following components:      Result Value   Leukocytes, UA Trace (*)    All other components within normal limits  URINE CULTURE  RPR  HIV ANTIBODY (ROUTINE TESTING W REFLEX)  POCT URINE PREGNANCY  CERVICOVAGINAL ANCILLARY ONLY    EKG   Radiology No results found.  Procedures Procedures (including critical care time)  Medications Ordered in UC  Medications - No data to  display  Initial Impression / Assessment and Plan / UC Course  I have reviewed the triage vital signs and the nursing notes.  Pertinent labs & imaging results that were available during my care of the patient were reviewed by me and considered in my medical decision making (see chart for details).    Plan: 1.  The vaginitis will be treated with the following: A.  STI testing is pending and treatment will determine on the results of the testing to include BV and yeast screening. 2.  The amenorrhea will be treated with the following: A.  Pregnancy: 3.  STI screening will be treated with the following: A.  Will be initiated depending on the results of the test. 3.  Urine culture is pending and treatment will be directed depending on the results of the culture. 4.  Patient advised follow-up PCP return to urgent care if symptoms fail to improve. Final Clinical Impressions(s) / UC Diagnoses   Final diagnoses:  Routine screening for STI (sexually transmitted infection)  Amenorrhea  Vaginitis and vulvovaginitis  Dysuria     Discharge Instructions      Lab testing will be completed in 48 to 72 hours.  If you do not get a call from this office in that timeframe it indicates the test are negative.  MyChart to view the test results when they post in 48 to 72 hours. Follow-up with PCP or return to urgent care if symptoms fail to improve.    ED Prescriptions   None    PDMP not reviewed this encounter.   Nyoka Lint, PA-C 11/29/21 1540

## 2021-11-29 NOTE — ED Triage Notes (Signed)
Seen 11/02/2021.  Patient reports a poor appetite at the time she was prescribed metronidazole.  Did not finish this drug.    Patient has vaginal discharge, discharge did not stop.

## 2021-11-29 NOTE — Discharge Instructions (Addendum)
Lab testing will be completed in 48 to 72 hours.  If you do not get a call from this office in that timeframe it indicates the test are negative.  MyChart to view the test results when they post in 48 to 72 hours. Follow-up with PCP or return to urgent care if symptoms fail to improve.

## 2021-11-30 ENCOUNTER — Ambulatory Visit: Payer: Medicaid Other

## 2021-11-30 LAB — CERVICOVAGINAL ANCILLARY ONLY
Bacterial Vaginitis (gardnerella): NEGATIVE
Candida Glabrata: NEGATIVE
Candida Vaginitis: POSITIVE — AB
Chlamydia: NEGATIVE
Comment: NEGATIVE
Comment: NEGATIVE
Comment: NEGATIVE
Comment: NEGATIVE
Comment: NEGATIVE
Comment: NORMAL
Neisseria Gonorrhea: NEGATIVE
Trichomonas: NEGATIVE

## 2021-11-30 LAB — RPR: RPR Ser Ql: NONREACTIVE

## 2021-11-30 LAB — HIV ANTIBODY (ROUTINE TESTING W REFLEX): HIV Screen 4th Generation wRfx: NONREACTIVE

## 2021-12-01 ENCOUNTER — Telehealth (HOSPITAL_COMMUNITY): Payer: Self-pay | Admitting: Emergency Medicine

## 2021-12-01 LAB — URINE CULTURE

## 2021-12-01 MED ORDER — FLUCONAZOLE 150 MG PO TABS: 150.0000 mg | ORAL_TABLET | Freq: Once | ORAL | 0 refills | Status: AC

## 2022-01-23 ENCOUNTER — Ambulatory Visit: Payer: Medicaid Other

## 2022-01-25 ENCOUNTER — Ambulatory Visit
Admission: EM | Admit: 2022-01-25 | Discharge: 2022-01-25 | Disposition: A | Discharge status: Home / Self Care | Payer: Medicaid Other | Attending: Physician Assistant | Admitting: Physician Assistant

## 2022-01-25 ENCOUNTER — Ambulatory Visit: Payer: Medicaid Other

## 2022-01-25 DIAGNOSIS — N3000 Acute cystitis without hematuria: Secondary | ICD-10-CM | POA: Diagnosis not present

## 2022-01-25 DIAGNOSIS — J020 Streptococcal pharyngitis: Secondary | ICD-10-CM | POA: Insufficient documentation

## 2022-01-25 DIAGNOSIS — R300 Dysuria: Secondary | ICD-10-CM | POA: Insufficient documentation

## 2022-01-25 LAB — POCT URINALYSIS DIP (MANUAL ENTRY)
Bilirubin, UA: NEGATIVE
Glucose, UA: NEGATIVE mg/dL
Ketones, POC UA: NEGATIVE mg/dL
Nitrite, UA: NEGATIVE
Spec Grav, UA: 1.025 (ref 1.010–1.025)
Urobilinogen, UA: 0.2 E.U./dL
pH, UA: 7 (ref 5.0–8.0)

## 2022-01-25 LAB — POCT RAPID STREP A (OFFICE): Rapid Strep A Screen: POSITIVE — AB

## 2022-01-25 LAB — POCT URINE PREGNANCY: Preg Test, Ur: NEGATIVE

## 2022-01-25 MED ORDER — AMOXICILLIN-POT CLAVULANATE 875-125 MG PO TABS: 1.0000 | ORAL_TABLET | Freq: Two times a day (BID) | ORAL | 0 refills | Status: DC

## 2022-01-25 NOTE — ED Triage Notes (Signed)
Pt c/o sore throat, lymphadenopathy x ~ 2 days. States she was feeling unwell over a week ago, felt better and then 2 days ago she started feeling unwell again.   Also states menstrual 45 days late despite preg(-) at home. Hx of irregular cycles but usually +/- 1 week not 45 days. Had "yeast infection symptoms" that resolved with an old dose of flagy. Associated dysuria.

## 2022-01-25 NOTE — ED Provider Notes (Signed)
[name]    CSN: [id number] Arrival date & time: 01/25/22  0804      History   Chief Complaint Chief Complaint  Patient presents with   Sore Throat    HPI Jill Neal is a 21 y.o. female.   Patient here today for evaluation of sore throat that started about 2 days ago.  She reports that she has not had any fever other than what she has in office today.  She has tried over-the-counter medication without resolution.  She reports minimal cough and denies any congestion or ear pain.  She has not had any vomiting or diarrhea.  She also reports that she is late for her period by about 45 days. She does have history of irregular menses. She has taken home pregnancy tests that were negative. She would like STD screening and screening for yeast, BV. She was having yeast infection symptoms but took a left over flagyl with improvement.   The history is provided by the patient.    Past Medical History:  Diagnosis Date   Anemia    Anemia in pregnancy, third trimester 03/06/2019   Hgb 9.8 at 28 week visit Rx for Ferrous Sulfate ordered   Chlamydia    Post-dates pregnancy 05/29/2019   Pregnancy affected by fetal growth restriction 05/29/2019   Short cervical length during pregnancy in second trimester 01/20/2019   2.7 cm on 01/20/19   Supervision of low-risk pregnancy 11/12/2018   BABYSCRIPTS PATIENT: [x ]Initial [x ]12 [x20 [x] 28 [ ] 32 [ ] 36 [ ] 38 [ ] 39 [ ] 40  Nursing Staff Provider Office Location  [facility] Dating   LMP and 6.1 week Language   English Anatomy   Normal  Flu Vaccine  12/04/2018 Genetic Screen   NIPS: low risk  AFP:   negative  TDaP vaccine   03/05/19 Hgb A1C or  GTT Early 5.2 Third trimester 79-108-93 Rhogam   N/A   LAB RESULTS  Feeding Plan  Bottle Bloo   UTI (lower urinary tract infection)     Patient Active Problem List   Diagnosis Date Noted   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 Authorizing Provider  amoxicillin-clavulanate (AUGMENTIN) 875-125 MG tablet Take 1 tablet by mouth every 12 (twelve) hours. 01/25/22  Yes 02/03/2019, PA-C  ibuprofen (ADVIL) 600 MG tablet Take 1 tablet (600 mg total) by mouth every 6 (six) hours as needed. 03/08/20   Lamptey, 01-16-2004, MD  metroNIDAZOLE (FLAGYL) 500 MG tablet Take 1 tablet (500 mg total) by mouth 2 (two) times daily. Patient not taking: Reported on 11/29/2021 11/03/21   01/27/22, MD  Probiotic Product (UP4 PROBIOTICS PO) Take by mouth.    [provider]  ferrous sulfate 325 (65 FE) MG tablet Take 1 tablet (325 mg total) by mouth  3 (three) times daily with meals. Patient not taking: No sig reported 03/10/19 03/08/20  Levie Heritage, DO  montelukast (SINGULAIR) 10 MG tablet Take 1 tablet (10 mg total) by mouth at bedtime. Patient not taking: Reported on 10/03/2018 11/22/17 10/03/18  Elvina Sidle, MD    Family History Family History  Problem Relation Age of Onset   Hypertension Other    Healthy Mother    Healthy Father     Social History Social History   Tobacco Use   Smoking status: Never   Smokeless tobacco: Never  Vaping Use   Vaping Use: Former  Substance Use Topics   Alcohol use: Not Currently   Drug use: Not Currently    Types: Marijuana    Comment: last used July     Allergies   Ciprofloxacin   Review of Systems Review of Systems  Constitutional:  Positive for fever. Negative for chills.  HENT:  Positive for sore throat. Negative for congestion.   Eyes:  Negative for discharge and redness.  Respiratory:  Positive for cough. Negative for  shortness of breath.   Gastrointestinal:  Negative for abdominal pain, diarrhea, nausea and vomiting.  Genitourinary:  Negative for vaginal discharge.     Physical Exam Triage Vital Signs ED Triage Vitals  Enc Vitals Group     BP      Pulse      Resp      Temp      Temp src      SpO2      Weight      Height      Head Circumference      Peak Flow      Pain Score      Pain Loc      Pain Edu?      Excl. in [place]?    No data found.  Updated Vital Signs BP 130/81 (BP Location: Right Arm)   Pulse 96   Temp 99.6 F (37.6 C) (Oral)   Resp 18   LMP  (LMP Unknown)   SpO2 97%      Physical Exam Vitals and nursing note reviewed.  Constitutional:      General: She is not in acute distress.    Appearance: Normal appearance. She is not ill-appearing.  HENT:     Head: Normocephalic and atraumatic.     Nose: No congestion or rhinorrhea.     Mouth/Throat:     Mouth: Mucous membranes are moist.     Pharynx: Posterior oropharyngeal erythema present. No oropharyngeal exudate.  Eyes:     Conjunctiva/sclera: Conjunctivae normal.  Cardiovascular:     Rate and Rhythm: Normal rate and regular rhythm.     Heart sounds: Normal heart sounds. No murmur heard. Pulmonary:     Effort: Pulmonary effort is normal. No respiratory distress.     Breath sounds: Normal breath sounds. No wheezing, rhonchi or rales.  Neurological:     Mental Status: She is alert.  Psychiatric:        Mood and Affect: Mood normal.        Behavior: Behavior normal.        Thought Content: Thought content normal.      UC Treatments / Results  Labs (all labs ordered are listed, but only abnormal results are displayed) Labs Reviewed  POCT URINALYSIS DIP (MANUAL ENTRY) - Abnormal; Notable for the following components:      Result Value   Clarity, UA cloudy (*)    Blood,  UA trace-intact (*)    Protein Ur, POC trace (*)    Leukocytes, UA Large (3+) (*)    All other components within normal limits  POCT RAPID  STREP A (OFFICE) - Abnormal; Notable for the following components:   Rapid Strep A Screen Positive (*)    All other components within normal limits  URINE CULTURE  POCT URINE PREGNANCY  CERVICOVAGINAL ANCILLARY ONLY    EKG   Radiology No results found.  Procedures Procedures (including critical care time)  Medications Ordered in UC Medications - No data to display  Initial Impression / Assessment and Plan / UC Course  I have reviewed the triage vital signs and the nursing notes.  Pertinent labs & imaging results that were available during my care of the patient were reviewed by me and considered in my medical decision making (see chart for details).    Strep test positive in office, UA consistent with UTI- will treat with augmentin to cover both. Urine culture ordered. Urine pregnancy negative in office- recommended repeat screening in 1-2 weeks if she continues to go without period. Patient expresses understanding.   Final Clinical Impressions(s) / UC Diagnoses   Final diagnoses:  Dysuria  Strep pharyngitis  Acute cystitis without hematuria   Discharge Instructions   None    ED Prescriptions     Medication Sig Dispense Auth. Provider   amoxicillin-clavulanate (AUGMENTIN) 875-125 MG tablet Take 1 tablet by mouth every 12 (twelve) hours. 14 tablet Tomi Bamberger, PA-C      PDMP not reviewed this encounter.   Tomi Bamberger, PA-C 01/25/22 1344

## 2022-01-26 LAB — CERVICOVAGINAL ANCILLARY ONLY
Bacterial Vaginitis (gardnerella): POSITIVE — AB
Candida Glabrata: NEGATIVE
Candida Vaginitis: NEGATIVE
Chlamydia: NEGATIVE
Comment: NEGATIVE
Comment: NEGATIVE
Comment: NEGATIVE
Comment: NEGATIVE
Comment: NEGATIVE
Comment: NORMAL
Neisseria Gonorrhea: NEGATIVE
Trichomonas: NEGATIVE

## 2022-01-27 ENCOUNTER — Telehealth (HOSPITAL_COMMUNITY): Payer: Self-pay | Admitting: Emergency Medicine

## 2022-01-27 LAB — URINE CULTURE: Culture: >=100000 — AB

## 2022-01-27 MED ORDER — METRONIDAZOLE 500 MG PO TABS: 500.0000 mg | ORAL_TABLET | Freq: Two times a day (BID) | ORAL | 0 refills | Status: DC

## 2022-03-12 ENCOUNTER — Ambulatory Visit: Payer: Medicaid Other

## 2022-03-13 ENCOUNTER — Ambulatory Visit: Payer: Medicaid Other

## 2022-03-17 ENCOUNTER — Ambulatory Visit: Payer: Medicaid Other

## 2022-03-17 ENCOUNTER — Ambulatory Visit
Admission: EM | Admit: 2022-03-17 | Discharge: 2022-03-17 | Disposition: A | Discharge status: Home / Self Care | Payer: Medicaid Other | Attending: Urgent Care | Admitting: Urgent Care

## 2022-03-17 DIAGNOSIS — N3001 Acute cystitis with hematuria: Secondary | ICD-10-CM | POA: Diagnosis not present

## 2022-03-17 DIAGNOSIS — R350 Frequency of micturition: Secondary | ICD-10-CM

## 2022-03-17 LAB — POCT URINALYSIS DIP (MANUAL ENTRY)
Bilirubin, UA: NEGATIVE
Glucose, UA: NEGATIVE mg/dL
Ketones, POC UA: NEGATIVE mg/dL
Nitrite, UA: NEGATIVE
Protein Ur, POC: 100 mg/dL — AB
Spec Grav, UA: 1.025 (ref 1.010–1.025)
Urobilinogen, UA: 0.2 E.U./dL
pH, UA: 7 (ref 5.0–8.0)

## 2022-03-17 LAB — POCT URINE PREGNANCY: Preg Test, Ur: NEGATIVE

## 2022-03-17 MED ORDER — CIPROFLOXACIN HCL 500 MG PO TABS: 500.0000 mg | ORAL_TABLET | Freq: Two times a day (BID) | ORAL | 0 refills | Status: DC

## 2022-03-17 MED ORDER — ONDANSETRON 8 MG PO TBDP: 8.0000 mg | ORAL_TABLET | Freq: Three times a day (TID) | ORAL | 0 refills | Status: DC | PRN

## 2022-03-17 NOTE — Discharge Instructions (Signed)
Please start ciprofloxacin to address an urinary tract infection. Make sure you hydrate very well with plain water and a quantity of 64 ounces of water a day. Use Zofran as needed for nausea and vomiting with ciprofloxacin.  Please limit drinks that are considered urinary irritants such as soda, sweet tea, coffee, energy drinks, alcohol.  These can worsen your urinary and genital symptoms but also be the source of them.  I will let you know about your urine culture and vaginal swab results through MyChart to see if we need to prescribe or change your antibiotics based off of those results.

## 2022-03-17 NOTE — ED Triage Notes (Signed)
Pt c/o urinary freq and vaginal discharge x 2 weeks-NAD-steady gait

## 2022-03-17 NOTE — ED Provider Notes (Signed)
[name]  Note:  This document was prepared using Systems analyst and may include unintentional dictation errors.  MRN: [id number] DOB: Apr 03, 2000  Subjective:   Jill Neal is a 22 y.o. female presenting for 2-week history of persistent urinary frequency, urinary urgency and vaginal discharge.  The discharge is improved and only has slight discharge now.  Was last treated for an urinary tract infection with Augmentin from a visit she had 01/25/2022.  She also has a history of BV and yeast infections.  From that same visit, she did complete treatment for bacterial vaginosis with metronidazole.  She actually had left over cephalexin, had 2 doses which she took. She also had left over metronidazole and also took 2 doses.  No fever, nausea, vomiting, abdominal pelvic pain, hematuria, vaginal bleeding.  No current facility-administered medications for this encounter.  Current Outpatient Medications:    amoxicillin-clavulanate (AUGMENTIN) 875-125 MG tablet, Take 1 tablet by mouth every 12 (twelve) hours., Disp: 14 tablet, Rfl: 0   ibuprofen (ADVIL) 600 MG tablet, Take 1 tablet (600 mg total) by mouth every 6 (six) hours as needed., Disp: 30 tablet, Rfl: 0   metroNIDAZOLE (FLAGYL) 500 MG tablet, Take 1 tablet (500 mg total) by mouth 2 (two) times daily., Disp: 14 tablet, Rfl: 0   Probiotic Product (UP4 PROBIOTICS PO), Take by mouth., Disp: , Rfl:    Allergies  Allergen Reactions   Ciprofloxacin Nausea Only    Past Medical History:  Diagnosis Date   Anemia    Anemia in pregnancy, third trimester 03/06/2019   Hgb 9.8 at 28 week visit Rx for Ferrous Sulfate ordered   Chlamydia    Post-dates pregnancy 05/29/2019   Pregnancy affected by fetal growth restriction 05/29/2019   Short cervical length during pregnancy in second trimester 01/20/2019   2.7 cm on 01/20/19   Supervision of low-risk pregnancy 11/12/2018   BABYSCRIPTS PATIENT: [x ]Initial [x  ]12 [x20 [x] 28 [ ] 32 [ ] 36 [ ] 38 [ ] 39 [ ] 40  Nursing Staff Provider Office Location  [facility] Dating   LMP and 6.1 week [place] Language   English Anatomy US   Normal  Flu Vaccine  12/04/2018 Genetic Screen   NIPS: low risk  AFP:   negative  TDaP vaccine   03/05/19 Hgb A1C or  GTT Early 5.2 Third trimester 79-108-93 Rhogam   N/A   LAB RESULTS  Feeding Plan  Bottle Bloo   UTI (lower urinary tract infection)      Past Surgical History:  Procedure Laterality Date   NO PAST SURGERIES      Family History  Problem Relation Age of Onset   Hypertension Other    Healthy Mother    Healthy Father     Social History   Tobacco Use   Smoking status: Never   Smokeless tobacco: Never  Vaping Use   Vaping Use: Every day  Substance Use Topics   Alcohol use: Yes    Comment: occ   Drug use: Not Currently    ROS   Objective:   Vitals: BP 119/80 (BP Location: Right Arm)   Pulse 81   Temp 99 F (37.2 C) (Oral)   Resp 16   LMP 03/13/2022   SpO2 96%   Physical Exam Constitutional:      General: She is not in acute distress.    Appearance: Normal appearance. She is well-developed. She is not ill-appearing, toxic-appearing or diaphoretic.  HENT:     Head:  Normocephalic and atraumatic.     Nose: Nose normal.     Mouth/Throat:     Mouth: Mucous membranes are moist.     Pharynx: Oropharynx is clear.  Eyes:     General: No scleral icterus.       Right eye: No discharge.        Left eye: No discharge.     Extraocular Movements: Extraocular movements intact.     Conjunctiva/sclera: Conjunctivae normal.  Cardiovascular:     Rate and Rhythm: Normal rate.  Pulmonary:     Effort: Pulmonary effort is normal.  Abdominal:     General: Bowel sounds are normal. There is no distension.     Palpations: Abdomen is soft. There is no mass.     Tenderness: There is no abdominal tenderness. There is no right CVA tenderness, left CVA tenderness, guarding or rebound.  Skin:    General: Skin is warm and  dry.  Neurological:     General: No focal deficit present.     Mental Status: She is alert and oriented to person, place, and time.  Psychiatric:        Mood and Affect: Mood normal.        Behavior: Behavior normal.        Thought Content: Thought content normal.        Judgment: Judgment normal.     Results for orders placed or performed during the hospital encounter of 03/17/22 (from the past 24 hour(s))  POCT urinalysis dipstick     Status: Abnormal   Collection Time: 03/17/22  7:35 PM  Result Value Ref Range   Color, UA yellow yellow   Clarity, UA cloudy (A) clear   Glucose, UA negative negative mg/dL   Bilirubin, UA negative negative   Ketones, POC UA negative negative mg/dL   Spec Grav, UA 1.025 1.010 - 1.025   Blood, UA large (A) negative   pH, UA 7.0 5.0 - 8.0   Protein Ur, POC =100 (A) negative mg/dL   Urobilinogen, UA 0.2 0.2 or 1.0 E.U./dL   Nitrite, UA Negative Negative   Leukocytes, UA Small (1+) (A) Negative  POCT urine pregnancy     Status: None   Collection Time: 03/17/22  7:35 PM  Result Value Ref Range   Preg Test, Ur Negative Negative    Assessment and Plan :   PDMP not reviewed this encounter.  1. Acute cystitis with hematuria   2. Urinary frequency     Patient declines empiric treatment for recurrent bacterial vaginosis and yeast vaginitis.  Would like to base treatment off of results. Start ciprofloxacin to cover for acute cystitis, urine culture pending.  Use Zofran to help with the nausea and vomiting associated with her Cipro use.  I am using this particular antibiotic as she has had multiple rounds recently.  Recommended aggressive hydration, limiting urinary irritants. Counseled patient on potential for adverse effects with medications prescribed/recommended today, ER and return-to-clinic precautions discussed, patient verbalized understanding.    Jaynee Eagles, [place] 03/18/22 [id number]

## 2022-03-18 ENCOUNTER — Telehealth: Payer: Medicaid Other | Admitting: Nurse Practitioner

## 2022-03-18 DIAGNOSIS — N3000 Acute cystitis without hematuria: Secondary | ICD-10-CM | POA: Diagnosis not present

## 2022-03-18 MED ORDER — CEPHALEXIN 500 MG PO CAPS: 500.0000 mg | ORAL_CAPSULE | Freq: Two times a day (BID) | ORAL | 0 refills | Status: DC

## 2022-03-18 NOTE — Progress Notes (Signed)
E-Visit for Urinary Problems  We are sorry that you are not feeling well.  Here is how we plan to help!  Based on what you shared with me it looks like you most likely have a simple urinary tract infection.  You were seen in the ED yesterday for this and was prescribed cipro. Take med as prescribed please. This should work fine to treat your infection. A UTI (Urinary Tract Infection) is a bacterial infection of the bladder.  Most cases of urinary tract infections are simple to treat but a key part of your care is to encourage you to drink plenty of fluids and watch your symptoms carefully.  I have prescribed nothing.  Your symptoms should gradually improve. Call us if the burning in your urine worsens, you develop worsening fever, back pain or pelvic pain or if your symptoms do not resolve after completing the antibiotic.  Urinary tract infections can be prevented by drinking plenty of water to keep your body hydrated.  Also be sure when you wipe, wipe from front to back and don't hold it in!  If possible, empty your bladder every 4 hours.  HOME CARE Drink plenty of fluids Compete the full course of the antibiotics even if the symptoms resolve Remember, when you need to go.go. Holding in your urine can increase the likelihood of getting a UTI! GET HELP RIGHT AWAY IF: You cannot urinate You get a high fever Worsening back pain occurs You see blood in your urine You feel sick to your stomach or throw up You feel like you are going to pass out  MAKE SURE YOU  Understand these instructions. Will watch your condition. Will get help right away if you are not doing well or get worse.   Thank you for choosing an e-visit.  Your e-visit answers were reviewed by a board certified advanced clinical practitioner to complete your personal care plan. Depending upon the condition, your plan could have included both over the counter or prescription medications.  Please review your pharmacy  choice. Make sure the pharmacy is open so you can pick up prescription now. If there is a problem, you may contact your provider through [employer] and have the prescription routed to another pharmacy.  Your safety is important to [place]. If you have drug allergies check your prescription carefully.   For the next 24 hours you can use MyChart to ask questions about today's visit, request a non-urgent call back, or ask for a work or school excuse. You will get an email in the next two days asking about your experience. I hope that your e-visit has been valuable and will speed your recovery. Mary-Margaret Hassell Done, FNP   5-10 minutes spent reviewing and documenting in chart.

## 2022-03-18 NOTE — Addendum Note (Signed)
Addended by: Chevis Pretty on: 03/18/2022 10:22 AM   Modules accepted: Orders

## 2022-03-19 LAB — URINE CULTURE: Culture: >=100000 — AB

## 2022-03-20 LAB — CERVICOVAGINAL ANCILLARY ONLY
Bacterial Vaginitis (gardnerella): POSITIVE — AB
Candida Glabrata: NEGATIVE
Candida Vaginitis: NEGATIVE
Chlamydia: NEGATIVE
Comment: NEGATIVE
Comment: NEGATIVE
Comment: NEGATIVE
Comment: NEGATIVE
Comment: NEGATIVE
Comment: NORMAL
Neisseria Gonorrhea: NEGATIVE
Trichomonas: NEGATIVE

## 2022-03-21 ENCOUNTER — Telehealth (HOSPITAL_COMMUNITY): Payer: Self-pay | Admitting: Emergency Medicine

## 2022-03-21 MED ORDER — METRONIDAZOLE 500 MG PO TABS: 500.0000 mg | ORAL_TABLET | Freq: Two times a day (BID) | ORAL | 0 refills | Status: DC

## 2022-03-23 ENCOUNTER — Telehealth: Payer: Medicaid Other | Admitting: Physician Assistant

## 2022-03-23 DIAGNOSIS — B379 Candidiasis, unspecified: Secondary | ICD-10-CM

## 2022-03-23 MED ORDER — FLUCONAZOLE 150 MG PO TABS: ORAL_TABLET | ORAL | 0 refills | Status: DC

## 2022-03-23 NOTE — Progress Notes (Signed)
E-Visit for Vaginal Symptoms  We are sorry that you are not feeling well. Here is how we plan to help! Based on what you shared with me it looks like you tested positive for BV, for which the metronidazole you were given by the Urgent Care provider is for. I will give you Diflucan (fluconazole) giving your history of yeast infections with antibiotics, to take as directed if those symptoms start.  Factors that increase your risk of developing vaginosis include: Medications, such as antibiotics and steroids Uncontrolled diabetes Use of hygiene products such as bubble bath, vaginal spray or vaginal deodorant Douching Wearing damp or tight-fitting clothing Using an intrauterine device (IUD) for birth control Hormonal changes, such as those associated with pregnancy, birth control pills or menopause Sexual activity Having a sexually transmitted infection  .   HOME CARE:  Good hygiene may prevent some types of vaginosis from recurring and may relieve some symptoms:  Avoid baths, hot tubs and whirlpool spas. Rinse soap from your outer genital area after a shower, and dry the area well to prevent irritation. Don't use scented or harsh soaps, such as those with deodorant or antibacterial action. Avoid irritants. These include scented tampons and pads. Wipe from front to back after using the toilet. Doing so avoids spreading fecal bacteria to your vagina.  Other things that may help prevent vaginosis include:  Don't douche. Your vagina doesn't require cleansing other than normal bathing. Repetitive douching disrupts the normal organisms that reside in the vagina and can actually increase your risk of vaginal infection. Douching won't clear up a vaginal infection. Use a latex condom. Both female and female latex condoms may help you avoid infections spread by sexual contact. Wear cotton underwear. Also wear pantyhose with a cotton crotch. If you feel comfortable without it, skip wearing underwear  to bed. Yeast thrives in [employer] Your symptoms should improve in the next day or two.  GET HELP RIGHT AWAY IF:  You have pain in your lower abdomen ( pelvic area or over your ovaries) You develop nausea or vomiting You develop a fever Your discharge changes or worsens You have persistent pain with intercourse You develop shortness of breath, a rapid pulse, or you faint.  These symptoms could be signs of problems or infections that need to be evaluated by a medical provider now.  MAKE SURE YOU   Understand these instructions. Will watch your condition. Will get help right away if you are not doing well or get worse.  Thank you for choosing an e-visit.  Your e-visit answers were reviewed by a board certified advanced clinical practitioner to complete your personal care plan. Depending upon the condition, your plan could have included both over the counter or prescription medications.  Please review your pharmacy choice. Make sure the pharmacy is open so you can pick up prescription now. If there is a problem, you may contact your provider through [employer] and have the prescription routed to another pharmacy.  Your safety is important to [place]. If you have drug allergies check your prescription carefully.   For the next 24 hours you can use MyChart to ask questions about today's visit, request a non-urgent call back, or ask for a work or school excuse. You will get an email in the next two days asking about your experience. I hope that your e-visit has been valuable and will speed your recovery.

## 2022-03-23 NOTE — Progress Notes (Signed)
I have spent 5 minutes in review of e-visit questionnaire, review and updating patient chart, medical decision making and response to patient.   Clearence Vitug Cody Garry, PA-C    

## 2022-04-02 ENCOUNTER — Encounter (HOSPITAL_COMMUNITY): Payer: Self-pay | Admitting: Emergency Medicine

## 2022-04-02 ENCOUNTER — Emergency Department (HOSPITAL_COMMUNITY)
Admission: EM | Admit: 2022-04-02 | Discharge: 2022-04-02 | Disposition: A | Discharge status: Home / Self Care | Payer: Medicaid Other | Attending: Emergency Medicine | Admitting: Emergency Medicine

## 2022-04-02 ENCOUNTER — Other Ambulatory Visit: Payer: Self-pay

## 2022-04-02 ENCOUNTER — Emergency Department (HOSPITAL_COMMUNITY): Payer: Medicaid Other

## 2022-04-02 DIAGNOSIS — R102 Pelvic and perineal pain: Secondary | ICD-10-CM | POA: Insufficient documentation

## 2022-04-02 DIAGNOSIS — N898 Other specified noninflammatory disorders of vagina: Secondary | ICD-10-CM | POA: Insufficient documentation

## 2022-04-02 DIAGNOSIS — R5383 Other fatigue: Secondary | ICD-10-CM | POA: Diagnosis not present

## 2022-04-02 DIAGNOSIS — M545 Low back pain, unspecified: Secondary | ICD-10-CM | POA: Diagnosis not present

## 2022-04-02 DIAGNOSIS — R109 Unspecified abdominal pain: Secondary | ICD-10-CM | POA: Diagnosis present

## 2022-04-02 DIAGNOSIS — R350 Frequency of micturition: Secondary | ICD-10-CM | POA: Insufficient documentation

## 2022-04-02 LAB — URINALYSIS, ROUTINE W REFLEX MICROSCOPIC
Bilirubin Urine: NEGATIVE
Glucose, UA: NEGATIVE mg/dL
Hgb urine dipstick: NEGATIVE
Ketones, ur: NEGATIVE mg/dL
Leukocytes,Ua: NEGATIVE
Nitrite: NEGATIVE
Protein, ur: NEGATIVE mg/dL
Specific Gravity, Urine: 1.026 (ref 1.005–1.030)
pH: 6 (ref 5.0–8.0)

## 2022-04-02 LAB — WET PREP, GENITAL
Clue Cells Wet Prep HPF POC: NONE SEEN
Sperm: NONE SEEN
Trich, Wet Prep: NONE SEEN
WBC, Wet Prep HPF POC: >=10 — AB (ref <10)
Yeast Wet Prep HPF POC: NONE SEEN

## 2022-04-02 NOTE — Discharge Instructions (Addendum)
You are seen today for pelvic pain and vaginal discharge.  Your testing was all very reassuring though your ultrasound showed a thickened endometrium.  [facility].  Come back to the ER if you have any new or worsening symptoms.

## 2022-04-02 NOTE — ED Provider Notes (Signed)
[place] EMERGENCY DEPARTMENT AT [facility] Provider Note   CSN: [id number] Arrival date & time: 04/02/22  1642     History  Chief Complaint  Patient presents with   Abdominal Pain   Vaginal Discharge    Jill Neal is a 22 y.o. female.  She has history of recurrent UTI recurrent bacterial vaginosis.  He was recently treated with cephalexin for UTI which were revealed Proteus.  She completed the entire course and states that her symptoms had resolved and now she is having some urinary frequency again and also having "snot like" vaginal discharge.  Denies any new sexual partners. She also reports that she is interested in having a "checkup" because she has not seen a primary care doctor in a long time and she has been feeling fatigued and generally unwell for a long time. States she had some low back pain as well, denies flank pain, nausea, vomiting, fevers or chills.  Chest pain or shortness of breath.  Patient reports that she is sexually active with 1 partner only   Abdominal Pain Associated symptoms: vaginal discharge   Vaginal Discharge Associated symptoms: abdominal pain        Home Medications Prior to Admission medications   Medication Sig Start Date End Date Taking? Authorizing Provider  ciprofloxacin (CIPRO) 500 MG tablet Take 1 tablet (500 mg total) by mouth 2 (two) times daily. 03/17/22   Jaynee Eagles, PA-C  fluconazole (DIFLUCAN) 150 MG tablet Take 1 tablet PO once. Repeat in 3 days if needed. 03/23/22   Brunetta Jeans, PA-C  ibuprofen (ADVIL) 600 MG tablet Take 1 tablet (600 mg total) by mouth every 6 (six) hours as needed. 03/08/20   Lamptey, Myrene Galas, MD  metroNIDAZOLE (FLAGYL) 500 MG tablet Take 1 tablet (500 mg total) by mouth 2 (two) times daily. 03/21/22   Lamptey, Myrene Galas, MD  ondansetron (ZOFRAN-ODT) 8 MG disintegrating tablet Take 1 tablet (8 mg total) by mouth every 8 (eight) hours as needed for nausea or vomiting. 03/17/22   Jaynee Eagles,  PA-C  Probiotic Product (UP4 PROBIOTICS PO) Take by mouth.    [provider]  ferrous sulfate 325 (65 FE) MG tablet Take 1 tablet (325 mg total) by mouth 3 (three) times daily with meals. Patient not taking: No sig reported 03/10/19 03/08/20  Truett Mainland, DO  montelukast (SINGULAIR) 10 MG tablet Take 1 tablet (10 mg total) by mouth at bedtime. Patient not taking: Reported on 10/03/2018 11/22/17 10/03/18  Robyn Haber, MD      Allergies    Ciprofloxacin    Review of Systems   Review of Systems  Gastrointestinal:  Positive for abdominal pain.  Genitourinary:  Positive for vaginal discharge.    Physical Exam Updated Vital Signs BP 100/89 (BP Location: Right Arm)   Pulse 90   Temp 98.6 F (37 C) (Oral)   Resp 18   Wt 86 kg   LMP 03/13/2022   SpO2 100%   BMI 29.69 kg/m  Physical Exam Vitals and nursing note reviewed. Exam conducted with a chaperone present.  Constitutional:      General: She is not in acute distress.    Appearance: She is well-developed.  HENT:     Head: Normocephalic and atraumatic.  Eyes:     Conjunctiva/sclera: Conjunctivae normal.  Cardiovascular:     Rate and Rhythm: Normal rate and regular rhythm.     Heart sounds: No murmur heard. Pulmonary:     Effort: Pulmonary  effort is normal. No respiratory distress.     Breath sounds: Normal breath sounds.  Abdominal:     Palpations: Abdomen is soft.     Tenderness: There is no abdominal tenderness. Negative signs include Murphy's sign, Rovsing's sign and McBurney's sign.  Genitourinary:    Vagina: Normal.     Cervix: Normal.     Uterus: Normal.      Adnexa: Right adnexa normal.       Left: Tenderness present.   Musculoskeletal:        General: No swelling.     Cervical back: Neck supple.  Skin:    General: Skin is warm and dry.     Capillary Refill: Capillary refill takes less than 2 seconds.  Neurological:     Mental Status: She is alert.  Psychiatric:        Mood and Affect: Mood  normal.     ED Results / Procedures / Treatments   Labs (all labs ordered are listed, but only abnormal results are displayed) Labs Reviewed  WET PREP, GENITAL - Abnormal; Notable for the following components:      Result Value   WBC, Wet Prep HPF POC >=10 (*)    All other components within normal limits  URINALYSIS, ROUTINE W REFLEX MICROSCOPIC  GC/CHLAMYDIA PROBE AMP ([place]) NOT AT [facility]    EKG None  Radiology US PELVIC COMPLETE W TRANSVAGINAL AND TORSION R/O  Result Date: 04/02/2022 CLINICAL DATA:  Left adnexal tenderness EXAM: TRANSABDOMINAL AND TRANSVAGINAL ULTRASOUND OF PELVIS TECHNIQUE: Both transabdominal and transvaginal ultrasound examinations of the pelvis were performed. Transabdominal technique was performed for global imaging of the pelvis including uterus, ovaries, adnexal regions, and pelvic cul-de-sac. It was necessary to proceed with endovaginal exam following the transabdominal exam to visualize the uterus, endometrium, ovaries and adnexa. COMPARISON:  None Available. FINDINGS: Uterus Measurements: 7.9 x 3.4 x 5.4 cm = volume: 75 mL. No fibroids or other mass visualized. Endometrium Thickness: 9 mm in thickness.  No focal abnormality visualized. Right ovary Measurements: 4.1 x 3.4 x 3.4 cm = volume: 25 mL. Normal appearance/no adnexal mass. Left ovary Measurements: 3.3 x 2.2 x 2.8 cm = volume: 11 mL. Normal appearance/no adnexal mass. Other findings No abnormal free fluid. IMPRESSION: Normal study. Electronically Signed   By: Rolm Baptise M.D.   On: 04/02/2022 21:19    Procedures Procedures    Medications Ordered in ED Medications - No data to display  ED Course/ Medical Decision Making/ A&P                             Medical Decision Making This patient presents to the ED for concern of thickened vaginal discharge and left pelvic cramping, this involves an extensive number of treatment options, and is a complaint that carries with it a high risk of  complications and morbidity.  The differential diagnosis includes vaginitis, UTI,, ovarian cyst, PID, TOA, other     Additional history obtained:  Additional history obtained from EMR External records from outside source obtained and reviewed including prior urine cultures and urgent care visits for similar symptoms   Lab Tests:  I Ordered, and personally interpreted labs.  The pertinent results include: Normal urinalysis, wet prep shows white blood cells but no BV, no trichomoniasis, no yeast   Imaging Studies ordered:  I ordered imaging studies including ultrasound pelvis I independently visualized and interpreted imaging which showed an endometrium I agree with  the radiologist interpretation     Problem List / ED Course / Critical interventions / Medication management Patient comes in with concern for thick vaginal discharge and ongoing left lower pelvic cramping that she has been treated for UTI and BV multiple times and states that the UTI symptoms went away but she still having the same sensation in the left pelvic area-ultrasound ordered and shows thick endometrium but no other abnormal findings.    Test / Admission - Considered:  CT of abdominal exam is very reassuring no right lower quadrant tenderness no right upper quadrant tenderness-no need for further imaging at this time    Amount and/or Complexity of Data Reviewed Labs: ordered. Radiology: ordered.           Final Clinical Impression(s) / ED Diagnoses Final diagnoses:  Pelvic pain    Rx / DC Orders ED Discharge Orders     None         Darci Current 04/02/22 2220    Isla Pence, MD 04/02/22 2337

## 2022-04-02 NOTE — ED Triage Notes (Signed)
Recently treated for UTI and BV with complete course of abt finished 1 week ago Pt reports still having abd pain radiating to back.  Pt reports neon color urine and mucous looking vaginal discharge.  Denies fever, body aches, chills.

## 2022-04-03 LAB — GC/CHLAMYDIA PROBE AMP (~~LOC~~) NOT AT ARMC
Chlamydia: POSITIVE — AB
Comment: NEGATIVE
Comment: NORMAL
Neisseria Gonorrhea: NEGATIVE

## 2022-04-04 ENCOUNTER — Telehealth (HOSPITAL_COMMUNITY): Payer: Self-pay | Admitting: Emergency Medicine

## 2022-04-04 MED ORDER — DOXYCYCLINE HYCLATE 100 MG PO CAPS: 100.0000 mg | ORAL_CAPSULE | Freq: Two times a day (BID) | ORAL | 0 refills | Status: AC

## 2022-04-04 NOTE — Telephone Encounter (Signed)
STI resulted, chlamydia positive, gonorrhea neg. Sent doxy to pharmacy. Nursing d/w pt, they will f/u with ob. Advise to refrain from intercourse until completion of abx and to advise partner to seek testing/treatment

## 2022-04-07 ENCOUNTER — Ambulatory Visit
Admission: EM | Admit: 2022-04-07 | Discharge: 2022-04-07 | Disposition: A | Discharge status: Home / Self Care | Payer: Medicaid Other

## 2022-04-07 DIAGNOSIS — A749 Chlamydial infection, unspecified: Secondary | ICD-10-CM

## 2022-04-07 NOTE — Discharge Instructions (Signed)
Please doxycycline as prescribed May get retested in 4 to 6 weeks if needed Follow-up with your PCP in 2 to 3 days for recheck Please go to the ER if you have any worsening symptoms

## 2022-04-07 NOTE — ED Provider Notes (Signed)
[facility]    CSN: [id number] Arrival date & time: 04/07/22  1450      History   Chief Complaint Chief Complaint  Patient presents with   std check    HPI Jill Neal is a 22 y.o. female presents for questions regarding chlamydia infection.  Patient was seen at [facility] emergency room on 2/4 for pelvic pain.  She was positive for chlamydia and is currently taking doxycycline.  She is concerned that it might be a false positive.  She states she was symptomatic at time of evaluation and those symptoms are improving as she is taking the doxycycline.  Denies dysuria or fevers.  No known STD exposure or concern.  She does not have a gynecologist.  No other concerns at this time.  HPI  Past Medical History:  Diagnosis Date   Anemia    Anemia in pregnancy, third trimester 03/06/2019   Hgb 9.8 at 28 week visit Rx for Ferrous Sulfate ordered   Chlamydia    Post-dates pregnancy 05/29/2019   Pregnancy affected by fetal growth restriction 05/29/2019   Short cervical length during pregnancy in second trimester 01/20/2019   2.7 cm on 01/20/19   Supervision of low-risk pregnancy 11/12/2018   BABYSCRIPTS PATIENT: [x ]Initial [x ]12 [x20 [x]$ 28 [ ]$ 32 [ ]$ 36 [ ]$ 38 [ ]$ 39 [ ]$ 40  Nursing Staff Provider Office Location  [facility] Dating   LMP and 6.1 week [place] Language   English Anatomy US   Normal  Flu Vaccine  12/04/2018 Genetic Screen   NIPS: low risk  AFP:   negative  TDaP vaccine   03/05/19 Hgb A1C or  GTT Early 5.2 Third trimester 79-108-93 Rhogam   N/A   LAB RESULTS  Feeding Plan  Bottle Bloo   UTI (lower urinary tract infection)     Patient Active Problem List   Diagnosis Date Noted   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 Authorizing Provider  doxycycline (VIBRAMYCIN) 100 MG capsule Take 1 capsule (100 mg total) by mouth 2 (two) times daily for 7 days. 04/04/22 04/11/22 Yes Jeanell Sparrow, DO  ciprofloxacin (CIPRO) 500 MG tablet Take 1 tablet (500 mg total) by mouth 2 (two) times daily. 03/17/22   Jaynee Eagles, PA-C  fluconazole (DIFLUCAN) 150 MG tablet Take 1 tablet PO once. Repeat in 3 days if needed. 03/23/22   Brunetta Jeans, PA-C  ibuprofen (ADVIL) 600 MG tablet Take 1 tablet (600 mg total) by mouth every 6 (six) hours as needed. 03/08/20   Lamptey, Myrene Galas, MD  metroNIDAZOLE (FLAGYL) 500 MG tablet Take 1 tablet (500 mg total) by mouth 2 (two) times daily. 03/21/22   Lamptey, Myrene Galas, MD  ondansetron (ZOFRAN-ODT) 8 MG disintegrating tablet Take 1 tablet (8 mg total) by mouth every 8 (eight) hours as needed  for nausea or vomiting. 03/17/22   Jaynee Eagles, PA-C  Probiotic Product (UP4 PROBIOTICS PO) Take by mouth.    [provider]  ferrous sulfate 325 (65 FE) MG tablet Take 1 tablet (325 mg total) by mouth 3 (three) times daily with meals. Patient not taking: No sig reported 03/10/19 03/08/20  Truett Mainland, DO  montelukast (SINGULAIR) 10 MG tablet Take 1 tablet (10 mg total) by mouth at bedtime. Patient not taking: Reported on 10/03/2018 11/22/17 10/03/18  Robyn Haber, MD    Family History Family History  Problem Relation Age of Onset   Hypertension Other    Healthy Mother    Healthy Father     Social History Social History   Tobacco Use   Smoking status: Never   Smokeless tobacco: Never  Vaping Use   Vaping Use: Every day  Substance Use Topics   Alcohol use: Not Currently    Comment: occ   Drug use: Not Currently     Allergies   Ciprofloxacin   Review of Systems Review of Systems  Genitourinary:  Positive for vaginal discharge.     Physical  Exam Triage Vital Signs ED Triage Vitals  Enc Vitals Group     BP 04/07/22 1530 110/79     Pulse Rate 04/07/22 1530 84     Resp 04/07/22 1530 20     Temp 04/07/22 1530 97.9 F (36.6 C)     Temp Source 04/07/22 1530 Oral     SpO2 04/07/22 1530 97 %     Weight 04/07/22 1528 200 lb (90.7 kg)     Height 04/07/22 1528 5' 7"$  (1.702 m)     Head Circumference --      Peak Flow --      Pain Score 04/07/22 1528 0     Pain Loc --      Pain Edu? --      Excl. in [place]? --    No data found.  Updated Vital Signs BP 110/79 (BP Location: Left Arm)   Pulse 84   Temp 97.9 F (36.6 C) (Oral)   Resp 20   Ht 5' 7"$  (1.702 m)   Wt 200 lb (90.7 kg)   LMP 03/13/2022   SpO2 97%   BMI 31.32 kg/m   Visual Acuity Right Eye Distance:   Left Eye Distance:   Bilateral Distance:    Right Eye Near:   Left Eye Near:    Bilateral Near:     Physical Exam Vitals and nursing note reviewed.  Constitutional:      Appearance: Normal appearance.  HENT:     Head: Normocephalic and atraumatic.  Eyes:     Pupils: Pupils are equal, round, and reactive to light.  Cardiovascular:     Rate and Rhythm: Normal rate.  Pulmonary:     Effort: Pulmonary effort is normal.  Skin:    General: Skin is warm and dry.  Neurological:     General: No focal deficit present.     Mental Status: She is alert and oriented to person, place, and time.  Psychiatric:        Mood and Affect: Mood normal.        Behavior: Behavior normal.      UC Treatments / Results  Labs (all labs ordered are listed, but only abnormal results are displayed) Labs Reviewed - No data to display  EKG   Radiology No results found.  Procedures Procedures (including critical care time)  Medications Ordered  in UC Medications - No data to display  Initial Impression / Assessment and Plan / UC Course  I have reviewed the triage vital signs and the nursing notes.  Pertinent labs & imaging results that were available during my care  of the patient were reviewed by me and considered in my medical decision making (see chart for details).     Patient primarily wanted to be seen for questions regarding chlamydia and treatment  Advised her to complete her doxycycline as prescribed and she can get retested at 4 to 6 weeks and she verbalized understanding Advised to inform any sexual partners so they may be tested/treated Follow-up with PCP in 2 to 3 days for recheck ER precautions reviewed and patient verbalized understanding Final Clinical Impressions(s) / UC Diagnoses   Final diagnoses:  None   Discharge Instructions   None    ED Prescriptions   None    PDMP not reviewed this encounter.   Melynda Ripple, NP 04/07/22 [phone]

## 2022-04-07 NOTE — ED Triage Notes (Signed)
Pt states that she would like to be tested for std. Pt denies any symptoms.

## 2022-04-18 ENCOUNTER — Ambulatory Visit: Payer: Medicaid Other

## 2022-04-30 ENCOUNTER — Other Ambulatory Visit: Payer: Self-pay

## 2022-04-30 ENCOUNTER — Ambulatory Visit
Admission: EM | Admit: 2022-04-30 | Discharge: 2022-04-30 | Disposition: A | Discharge status: Home / Self Care | Payer: Medicaid Other | Attending: Physician Assistant | Admitting: Physician Assistant

## 2022-04-30 ENCOUNTER — Encounter: Payer: Self-pay | Admitting: Emergency Medicine

## 2022-04-30 DIAGNOSIS — N760 Acute vaginitis: Secondary | ICD-10-CM | POA: Insufficient documentation

## 2022-04-30 DIAGNOSIS — N912 Amenorrhea, unspecified: Secondary | ICD-10-CM | POA: Diagnosis not present

## 2022-04-30 DIAGNOSIS — Z113 Encounter for screening for infections with a predominantly sexual mode of transmission: Secondary | ICD-10-CM | POA: Diagnosis present

## 2022-04-30 LAB — POCT URINE PREGNANCY: Preg Test, Ur: NEGATIVE

## 2022-04-30 NOTE — ED Triage Notes (Signed)
Pt sts recently treated for chlamydia but was unable to take 2 doses of meds and feels like she still has sx; pt sts she is 14 days late for her menstrual cycle as well

## 2022-04-30 NOTE — ED Provider Notes (Signed)
[name]    CSN: [id number] Arrival date & time: 04/30/22  1318      History   Chief Complaint Chief Complaint  Patient presents with   Exposure to STD    HPI Jill Neal is a 22 y.o. female.   22 year old female with vaginitis and amenorrhea.  Patient indicates that she is having intermittent vaginal discharge over the past week.  She indicates that it does have an odor, is light without external itching.  She is concerned about possibly having an STI.  She indicates that she was treated for chlamydia 2 weeks ago she was not able to finish the medication due to her significant other thrown out all of her meds.  The patient indicates she is concerned about not having her period on a regular time.  She indicates her last period was March 13, 2022.  She indicates her last episode of intercourse was 3 to 4 weeks ago and it was unprotected.  She is tolerating fluids well, she is without fever or chills.   Exposure to STD    Past Medical History:  Diagnosis Date   Anemia    Anemia in pregnancy, third trimester 03/06/2019   Hgb 9.8 at 28 week visit Rx for Ferrous Sulfate ordered   Chlamydia    Post-dates pregnancy 05/29/2019   Pregnancy affected by fetal growth restriction 05/29/2019   Short cervical length during pregnancy in second trimester 01/20/2019   2.7 cm on 01/20/19   Supervision of low-risk pregnancy 11/12/2018   BABYSCRIPTS PATIENT: [x ]Initial [x ]12 [x20 '[x]'$ 28 '[ ]'$ 32 '[ ]'$ 36 '[ ]'$ 38 '[ ]'$ 39 '[ ]'$ 40  Nursing Staff Provider Office Location  [facility] Dating   LMP and 6.1 week [place] Language   English Anatomy US   Normal  Flu Vaccine  12/04/2018 Genetic Screen   NIPS: low risk  AFP:   negative  TDaP vaccine   03/05/19 Hgb A1C or  GTT Early 5.2 Third trimester 79-108-93 Rhogam   N/A   LAB RESULTS  Feeding Plan  Bottle Bloo   UTI (lower urinary tract infection)     Patient Active Problem List   Diagnosis Date Noted   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 Authorizing Provider  ciprofloxacin (CIPRO) 500 MG tablet Take 1 tablet (500 mg total) by mouth 2 (two) times daily. Patient not taking: Reported on 04/30/2022 03/17/22   Jaynee Eagles, PA-C  fluconazole (DIFLUCAN) 150 MG tablet Take 1 tablet PO once. Repeat in 3 days if needed. Patient not taking: Reported on 04/30/2022 03/23/22   Brunetta Jeans, PA-C  ibuprofen (ADVIL) 600 MG tablet Take 1 tablet (600 mg total) by mouth every 6 (six) hours as needed. 03/08/20   Lamptey, Myrene Galas, MD  metroNIDAZOLE (FLAGYL) 500 MG tablet Take 1 tablet (500 mg total) by  mouth 2 (two) times daily. Patient not taking: Reported on 04/30/2022 03/21/22   Chase Picket, MD  ondansetron (ZOFRAN-ODT) 8 MG disintegrating tablet Take 1 tablet (8 mg total) by mouth every 8 (eight) hours as needed for nausea or vomiting. 03/17/22   Jaynee Eagles, PA-C  Probiotic Product (UP4 PROBIOTICS PO) Take by mouth.    [provider]  ferrous sulfate 325 (65 FE) MG tablet Take 1 tablet (325 mg total) by mouth 3 (three) times daily with meals. Patient not taking: No sig reported 03/10/19 03/08/20  Truett Mainland, DO  montelukast (SINGULAIR) 10 MG tablet Take 1 tablet (10 mg total) by mouth at bedtime. Patient not taking: Reported on 10/03/2018 11/22/17 10/03/18  Robyn Haber, MD    Family History Family History  Problem Relation Age of Onset   Hypertension Other    Healthy Mother    Healthy Father     Social History Social History   Tobacco Use   Smoking status: Never   Smokeless tobacco: Never  Vaping Use   Vaping Use: Every day   Substance Use Topics   Alcohol use: Not Currently    Comment: occ   Drug use: Not Currently     Allergies   Ciprofloxacin   Review of Systems Review of Systems  Genitourinary:  Positive for vaginal discharge (clear to cream with odor).     Physical Exam Triage Vital Signs ED Triage Vitals  Enc Vitals Group     BP 04/30/22 1409 113/78     Pulse Rate 04/30/22 1409 (!) 102     Resp 04/30/22 1409 18     Temp 04/30/22 1409 98.4 F (36.9 C)     Temp Source 04/30/22 1409 Oral     SpO2 04/30/22 1409 95 %     Weight --      Height --      Head Circumference --      Peak Flow --      Pain Score 04/30/22 1410 0     Pain Loc --      Pain Edu? --      Excl. in [place]? --    No data found.  Updated Vital Signs BP 113/78 (BP Location: Left Arm)   Pulse (!) 102   Temp 98.4 F (36.9 C) (Oral)   Resp 18   SpO2 95%   Visual Acuity Right Eye Distance:   Left Eye Distance:   Bilateral Distance:    Right Eye Near:   Left Eye Near:    Bilateral Near:     Physical Exam Constitutional:      Appearance: Normal appearance.  Abdominal:     General: Abdomen is flat. Bowel sounds are normal.     Palpations: Abdomen is soft.     Tenderness: There is no guarding or rebound.  Neurological:     Mental Status: She is alert.      UC Treatments / Results  Labs (all labs ordered are listed, but only abnormal results are displayed) Labs Reviewed  POCT URINE PREGNANCY  CERVICOVAGINAL ANCILLARY ONLY    EKG   Radiology No results found.  Procedures Procedures (including critical care time)  Medications Ordered in UC Medications - No data to display  Initial Impression / Assessment and Plan / UC Course  I have reviewed the triage vital signs and the nursing notes.  Pertinent labs & imaging results that were available during my care of the patient were reviewed by me and considered  in my medical decision making (see chart for details).    Plan: The diagnosis will be  treated with the following: 1.  Vaginitis: A.  STI screening results are pending. B.  Treatment may be modified depending on results of STI screening. 2.  Amenorrhea: A.  Patient advised to become established with GYN for amenorrhea, until then watchful waiting. 3.  STI screening: A.  Treatment may be modified depending on results of STI screening. 4.  Advised follow-up PCP or return to urgent care as needed. Final Clinical Impressions(s) / UC Diagnoses   Final diagnoses:  Vaginitis and vulvovaginitis  Routine screening for STI (sexually transmitted infection)  Amenorrhea     Discharge Instructions      Let testing will be completed in 48 hours.  If you do not hear from this office that indicates the test is negative.  Log onto MyChart to view the test results when it post in 48 hours.  Advised to follow-up with PCP return to urgent care as needed.    ED Prescriptions   None    PDMP not reviewed this encounter.   Nyoka Lint, PA-C 04/30/22 1426

## 2022-04-30 NOTE — Discharge Instructions (Signed)
Let testing will be completed in 48 hours.  If you do not hear from this office that indicates the test is negative.  Log onto MyChart to view the test results when it post in 48 hours.  Advised to follow-up with PCP return to urgent care as needed.

## 2022-05-01 LAB — CERVICOVAGINAL ANCILLARY ONLY
Bacterial Vaginitis (gardnerella): NEGATIVE
Candida Glabrata: NEGATIVE
Candida Vaginitis: NEGATIVE
Chlamydia: NEGATIVE
Comment: NEGATIVE
Comment: NEGATIVE
Comment: NEGATIVE
Comment: NEGATIVE
Comment: NEGATIVE
Comment: NORMAL
Neisseria Gonorrhea: NEGATIVE
Trichomonas: NEGATIVE

## 2022-05-13 ENCOUNTER — Telehealth: Payer: Medicaid Other | Admitting: Family Medicine

## 2022-05-13 DIAGNOSIS — B3731 Acute candidiasis of vulva and vagina: Secondary | ICD-10-CM

## 2022-05-13 NOTE — Progress Notes (Signed)

## 2022-05-14 MED ORDER — FLUCONAZOLE 150 MG PO TABS: 150.0000 mg | ORAL_TABLET | ORAL | 0 refills | Status: DC | PRN

## 2022-05-14 NOTE — Addendum Note (Signed)
Addended by: Mar Daring on: 05/14/2022 02:37 PM   Modules accepted: Orders

## 2022-06-27 ENCOUNTER — Inpatient Hospital Stay: Admission: RE | Admit: 2022-06-27 | Payer: Medicaid Other | Source: Ambulatory Visit

## 2022-07-11 ENCOUNTER — Ambulatory Visit: Payer: Medicaid Other

## 2022-07-16 ENCOUNTER — Ambulatory Visit: Payer: Medicaid Other

## 2022-07-25 ENCOUNTER — Ambulatory Visit: Payer: Medicaid Other

## 2022-07-29 DIAGNOSIS — E282 Polycystic ovarian syndrome: Secondary | ICD-10-CM

## 2022-07-29 HISTORY — DX: Polycystic ovarian syndrome: E28.2

## 2022-08-01 ENCOUNTER — Other Ambulatory Visit: Payer: Self-pay

## 2022-08-01 ENCOUNTER — Ambulatory Visit
Admission: RE | Admit: 2022-08-01 | Discharge: 2022-08-01 | Disposition: A | Discharge status: Home / Self Care | Payer: Medicaid Other | Source: Ambulatory Visit | Attending: Family Medicine | Admitting: Family Medicine

## 2022-08-01 VITALS — BP 114/74 | HR 98 | Temp 99.3°F | Resp 18

## 2022-08-01 DIAGNOSIS — N760 Acute vaginitis: Secondary | ICD-10-CM

## 2022-08-01 DIAGNOSIS — N911 Secondary amenorrhea: Secondary | ICD-10-CM | POA: Diagnosis present

## 2022-08-01 LAB — POCT URINE PREGNANCY: Preg Test, Ur: NEGATIVE

## 2022-08-01 NOTE — Discharge Instructions (Addendum)
The pregnancy test is negative  We have drawn blood to check your thyroid function and a prolactin level.  Staff will notify you if anything is abnormal  Staff will also notify you if there is anything positive on the swab you did  You can use the QR code/website at the back of the summary paperwork to schedule yourself a new patient appointment with primary care

## 2022-08-01 NOTE — ED Provider Notes (Signed)
[name]    CSN: [id number] Arrival date & time: 08/01/22  1656      History   Chief Complaint Chief Complaint  Patient presents with   Vaginal Discharge    Bv - Entered by patient    HPI Jill Neal is a 22 y.o. female.    Vaginal Discharge  Here for vaginal irritation and discharge.  She has had some occasional abdominal pain but nothing persistent.  No fever or chills.  She has had some nausea and she has had some breast tenderness.  Her last menstrual cycle was 48 days ago.    Past Medical History:  Diagnosis Date   Anemia    Anemia in pregnancy, third trimester 03/06/2019   Hgb 9.8 at 28 week visit Rx for Ferrous Sulfate ordered   Chlamydia    Post-dates pregnancy 05/29/2019   Pregnancy affected by fetal growth restriction 05/29/2019   Short cervical length during pregnancy in second trimester 01/20/2019   2.7 cm on 01/20/19   Supervision of low-risk pregnancy 11/12/2018   BABYSCRIPTS PATIENT: [x ]Initial [x ]12 [x20 [x] 28 [ ] 32 [ ] 36 [ ] 38 [ ] 39 [ ] 40  Nursing Staff Provider Office Location  [facility] Dating   LMP and 6.1 week [place] Language   English Anatomy US   Normal  Flu Vaccine  12/04/2018 Genetic Screen   NIPS: low risk  AFP:   negative  TDaP vaccine   03/05/19 Hgb A1C or  GTT Early 5.2 Third trimester 79-108-93 Rhogam   N/A   LAB RESULTS  Feeding Plan  Bottle Bloo   UTI (lower urinary tract infection)     Patient Active Problem List   Diagnosis Date Noted   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 Authorizing Provider  Probiotic Product (UP4 PROBIOTICS PO)  Take by mouth.    [provider]  ferrous sulfate 325 (65 FE) MG tablet Take 1 tablet (325 mg total) by mouth 3 (three) times daily with meals. Patient not taking: No sig reported 03/10/19 03/08/20  Levie Heritage, DO  montelukast (SINGULAIR) 10 MG tablet Take 1 tablet (10 mg total) by mouth at bedtime. Patient not taking: Reported on 10/03/2018 11/22/17 10/03/18  Elvina Sidle, MD    Family History Family History  Problem Relation Age of Onset   Hypertension Other    Healthy Mother    Healthy Father     Social History Social History   Tobacco Use   Smoking status: Never   Smokeless tobacco: Never  Vaping Use   Vaping Use: Every day  Substance Use Topics   Alcohol use: Not Currently    Comment: occ   Drug use: Not Currently     Allergies   Ciprofloxacin   Review of Systems Review of Systems  Genitourinary:  Positive for vaginal discharge.  Physical Exam Triage Vital Signs ED Triage Vitals [08/01/22 1716]  Enc Vitals Group     BP 114/74     Pulse Rate 98     Resp 18     Temp 99.3 F (37.4 C)     Temp Source Oral     SpO2 96 %     Weight      Height      Head Circumference      Peak Flow      Pain Score 0     Pain Loc      Pain Edu?      Excl. in [place]?    No data found.  Updated Vital Signs BP 114/74 (BP Location: Left Arm)   Pulse 98   Temp 99.3 F (37.4 C) (Oral)   Resp 18   SpO2 96%   Visual Acuity Right Eye Distance:   Left Eye Distance:   Bilateral Distance:    Right Eye Near:   Left Eye Near:    Bilateral Near:     Physical Exam Vitals reviewed.  Constitutional:      General: She is not in acute distress.    Appearance: She is not ill-appearing, toxic-appearing or diaphoretic.  HENT:     Mouth/Throat:     Mouth: Mucous membranes are moist.  Eyes:     Extraocular Movements: Extraocular movements intact.     Pupils: Pupils are equal, round, and reactive to light.  Cardiovascular:     Rate and Rhythm: Normal rate and  regular rhythm.  Pulmonary:     Effort: Pulmonary effort is normal.     Breath sounds: Normal breath sounds.  Abdominal:     General: There is no distension.     Palpations: Abdomen is soft.     Tenderness: There is no abdominal tenderness.  Skin:    Coloration: Skin is not pale.  Neurological:     General: No focal deficit present.     Mental Status: She is alert and oriented to person, place, and time.  Psychiatric:        Behavior: Behavior normal.      UC Treatments / Results  Labs (all labs ordered are listed, but only abnormal results are displayed) Labs Reviewed  PROLACTIN  TSH  POCT URINE PREGNANCY  CERVICOVAGINAL ANCILLARY ONLY    EKG   Radiology No results found.  Procedures Procedures (including critical care time)  Medications Ordered in UC Medications - No data to display  Initial Impression / Assessment and Plan / UC Course  I have reviewed the triage vital signs and the nursing notes.  Pertinent labs & imaging results that were available during my care of the patient were reviewed by me and considered in my medical decision making (see chart for details).       UPT is negative TSH is done as is the prolactin, to check for other causes of secondary amenorrhea.  Staff will notify her of any positives on the swab; she wishes to wait on the results to decide on any treatment Final Clinical Impressions(s) / UC Diagnoses   Final diagnoses:  Secondary amenorrhea  Acute vaginitis     Discharge Instructions      The pregnancy test is negative  We have drawn blood to check your thyroid function and a prolactin level.  Staff will notify you if anything is abnormal  Staff will also notify you if there is anything positive on the swab you did  You  can use the QR code/website at the back of the summary paperwork to schedule yourself a new patient appointment with primary care      ED Prescriptions   None    PDMP not reviewed this  encounter.   Zenia Resides, MD 08/01/22 [phone]

## 2022-08-01 NOTE — ED Triage Notes (Signed)
Pt here for vaginal discharge and foul odor; pt sts hx of BV in past; pt sts period is 2 months late but doesn't believe she is pregnant

## 2022-08-02 LAB — CERVICOVAGINAL ANCILLARY ONLY
Bacterial Vaginitis (gardnerella): NEGATIVE
Candida Glabrata: NEGATIVE
Candida Vaginitis: NEGATIVE
Chlamydia: NEGATIVE
Comment: NEGATIVE
Comment: NEGATIVE
Comment: NEGATIVE
Comment: NEGATIVE
Comment: NEGATIVE
Comment: NORMAL
Neisseria Gonorrhea: NEGATIVE
Trichomonas: NEGATIVE

## 2022-08-02 LAB — TSH: TSH: 0.407 u[IU]/mL — ABNORMAL LOW (ref 0.450–4.500)

## 2022-08-02 LAB — PROLACTIN: Prolactin: 9.3 ng/mL (ref 4.8–33.4)

## 2022-08-07 ENCOUNTER — Ambulatory Visit: Payer: Medicaid Other | Admitting: Medical

## 2022-08-09 ENCOUNTER — Encounter (HOSPITAL_COMMUNITY): Payer: Self-pay

## 2022-08-23 ENCOUNTER — Ambulatory Visit (INDEPENDENT_AMBULATORY_CARE_PROVIDER_SITE_OTHER): Payer: Medicaid Other | Admitting: Family Medicine

## 2022-08-23 ENCOUNTER — Encounter: Payer: Self-pay | Admitting: Family Medicine

## 2022-08-23 VITALS — BP 107/71 | HR 102 | Temp 98.0°F | Resp 18 | Ht 67.0 in | Wt 232.6 lb

## 2022-08-23 DIAGNOSIS — Z7689 Persons encountering health services in other specified circumstances: Secondary | ICD-10-CM | POA: Diagnosis not present

## 2022-08-23 DIAGNOSIS — R7302 Impaired glucose tolerance (oral): Secondary | ICD-10-CM | POA: Diagnosis not present

## 2022-08-23 DIAGNOSIS — N911 Secondary amenorrhea: Secondary | ICD-10-CM

## 2022-08-23 DIAGNOSIS — R5382 Chronic fatigue, unspecified: Secondary | ICD-10-CM | POA: Diagnosis not present

## 2022-08-23 NOTE — Progress Notes (Signed)
New Patient Office Visit  Subjective    Patient ID: Jill Neal, female    DOB: 20-Oct-2000  Age: 22 y.o. MRN: [id number]  CC:  Chief Complaint  Patient presents with   Establish Care    Patient is here to establish care with new PCP she states that for the past  months she has not had her cycle for going on 70 days, She states that here lately she is fatigued all the time, has gained weight, and had experienced a lot of muscle pains, She also states that she has had episodes to where she almost passes out.     HPI Jill Neal presents to establish care   She reports she went to urgent care due to irregular menses and abnormal vaginal smell. She had urine pregnancy test that was negative. She also had TSH done and was abnormal.  She reports she gave birth April 2021 but that was the only time she had amenorrhea. She reports her period is late by 70 days. She reports she sleeps a lot and is still fatigue. She gets lightheaded with doing mild objects. She reports the lightheadedness has been present for the last 2 years. Some days it's worse than others. She denies polydipsia. She hasn't been sexually active, last time about 3 months ago.  She feels confused sometimes and can't get her thoughts together.  She went to urgent care on 03/2022 for left flank pain. Ultrasound was done and was negative. She had + Chlamydia and had treatment for this.  Outpatient Encounter Medications as of 08/23/2022  Medication Sig   Probiotic Product (UP4 PROBIOTICS PO) Take by mouth. (Patient not taking: Reported on 08/23/2022)   [DISCONTINUED] ferrous sulfate 325 (65 FE) MG tablet Take 1 tablet (325 mg total) by mouth 3 (three) times daily with meals. (Patient not taking: No sig reported)   [DISCONTINUED] montelukast (SINGULAIR) 10 MG tablet Take 1 tablet (10 mg total) by mouth at bedtime. (Patient not taking: Reported on 10/03/2018)   No facility-administered encounter medications on file as of  08/23/2022.    Past Medical History:  Diagnosis Date   Anemia    Anemia in pregnancy, third trimester 03/06/2019   Hgb 9.8 at 28 week visit Rx for Ferrous Sulfate ordered   Chlamydia    Post-dates pregnancy 05/29/2019   Pregnancy affected by fetal growth restriction 05/29/2019   Short cervical length during pregnancy in second trimester 01/20/2019   2.7 cm on 01/20/19   Supervision of low-risk pregnancy 11/12/2018   BABYSCRIPTS PATIENT: [x ]Initial [x ]12 [x20 [x] 28 [ ] 32 [ ] 36 [ ] 38 [ ] 39 [ ] 40  Nursing Staff Provider Office Location  [facility] Dating   LMP and 6.1 week [place] Language   English Anatomy US   Normal  Flu Vaccine  12/04/2018 Genetic Screen   NIPS: low risk  AFP:   negative  TDaP vaccine   03/05/19 Hgb A1C or  GTT Early 5.2 Third trimester 79-108-93 Rhogam   N/A   LAB RESULTS  Feeding Plan  Bottle Bloo   UTI (lower urinary tract infection)     Past Surgical History:  Procedure Laterality Date   NO PAST SURGERIES      Family History  Problem Relation Age of Onset   Hypertension Other    Healthy Mother    Healthy Father     Social History   Socioeconomic History   Marital status: Single    Spouse name: Not on file  Number of children: Not on file   Years of education: Not on file   Highest education level: Not on file  Occupational History   Not on file  Tobacco Use   Smoking status: Never    Passive exposure: Never   Smokeless tobacco: Never  Vaping Use   Vaping Use: Every day  Substance and Sexual Activity   Alcohol use: Not Currently    Comment: occ   Drug use: Not Currently   Sexual activity: Yes    Birth control/protection: None  Other Topics Concern   Not on file  Social History Narrative   Not on file   Social Determinants of Health   Financial Resource Strain: Not on file  Food Insecurity: No Food Insecurity (12/03/2018)   Hunger Vital Sign    Worried About Running Out of Food in the Last Year: Never true    Ran Out of Food in the Last Year: Never  true  Transportation Needs: No Transportation Needs (12/03/2018)   PRAPARE - Administrator, Civil Service (Medical): No    Lack of Transportation (Non-Medical): No  Physical Activity: Not on file  Stress: Not on file  Social Connections: Not on file  Intimate Partner Violence: Not on file    Review of Systems  Constitutional:  Positive for malaise/fatigue.  Neurological:  Positive for dizziness.  Endo/Heme/Allergies:        Amenorrhea  All other systems reviewed and are negative.       Objective    BP 107/71   Pulse (!) 102   Temp 98 F (36.7 C) (Oral)   Resp 18   Ht 5\' 7"  (1.702 m)   Wt 232 lb 9.6 oz (105.5 kg)   SpO2 97%   BMI 36.43 kg/m   Physical Exam Vitals and nursing note reviewed.  Constitutional:      Appearance: She is obese.  HENT:     Head: Normocephalic and atraumatic.     Right Ear: External ear normal.     Left Ear: External ear normal.     Nose: Nose normal.     Mouth/Throat:     Mouth: Mucous membranes are moist.  Cardiovascular:     Rate and Rhythm: Normal rate and regular rhythm.     Pulses: Normal pulses.     Heart sounds: Normal heart sounds.  Pulmonary:     Effort: Pulmonary effort is normal.     Breath sounds: Normal breath sounds.  Abdominal:     General: Abdomen is flat.  Skin:    General: Skin is warm.     Capillary Refill: Capillary refill takes less than 2 seconds.  Neurological:     General: No focal deficit present.     Mental Status: She is alert and oriented to person, place, and time. Mental status is at baseline.  Psychiatric:        Mood and Affect: Mood normal.        Behavior: Behavior normal.        Thought Content: Thought content normal.        Judgment: Judgment normal.        Assessment & Plan:   Problem List Items Addressed This Visit   None Encounter to establish care with new doctor  Chronic fatigue -     TSH -     T4, free -     T3  Amenorrhea, secondary -     DHEA-sulfate -  Beta hCG quant (ref lab)  Impaired glucose tolerance -     CBC with Differential/Platelet -     Comprehensive metabolic panel -     Hemoglobin A1c   Discussed with pt that there are a number of conditions that can cause her symptoms. IN the urgent care, she had abnormal TSH. Urine pregnancy was negative. Will do thorough labs with thyroid, A1c, DHEA and Hcg quant. If negative, refer for TV ultrasound to rule out structural causes ie PCOS.  To follow up on results.  See back in 4 weeks for follow up.  No follow-ups on file.   Suzan Slick, MD

## 2022-08-24 ENCOUNTER — Other Ambulatory Visit: Payer: Self-pay | Admitting: Family Medicine

## 2022-08-24 DIAGNOSIS — N911 Secondary amenorrhea: Secondary | ICD-10-CM

## 2022-08-24 DIAGNOSIS — R7302 Impaired glucose tolerance (oral): Secondary | ICD-10-CM

## 2022-08-24 LAB — HEMOGLOBIN A1C
Est. average glucose Bld gHb Est-mCnc: 111 mg/dL
Hgb A1c MFr Bld: 5.5 % (ref 4.8–5.6)

## 2022-08-24 LAB — CBC WITH DIFFERENTIAL/PLATELET
Basophils Absolute: 0 10*3/uL (ref 0.0–0.2)
Basos: 0 %
EOS (ABSOLUTE): 0.1 10*3/uL (ref 0.0–0.4)
Eos: 1 %
Hematocrit: 35.7 % (ref 34.0–46.6)
Hemoglobin: 11.4 g/dL (ref 11.1–15.9)
Immature Grans (Abs): 0 10*3/uL (ref 0.0–0.1)
Immature Granulocytes: 0 %
Lymphocytes Absolute: 2.9 10*3/uL (ref 0.7–3.1)
Lymphs: 29 %
MCH: 24.9 pg — ABNORMAL LOW (ref 26.6–33.0)
MCHC: 31.9 g/dL (ref 31.5–35.7)
MCV: 78 fL — ABNORMAL LOW (ref 79–97)
Monocytes Absolute: 0.6 10*3/uL (ref 0.1–0.9)
Monocytes: 6 %
Neutrophils Absolute: 6.5 10*3/uL (ref 1.4–7.0)
Neutrophils: 64 %
Platelets: 354 10*3/uL (ref 150–450)
RBC: 4.58 x10E6/uL (ref 3.77–5.28)
RDW: 15.3 % (ref 11.7–15.4)
WBC: 10.1 10*3/uL (ref 3.4–10.8)

## 2022-08-24 LAB — COMPREHENSIVE METABOLIC PANEL
ALT: 31 IU/L (ref 0–32)
AST: 24 IU/L (ref 0–40)
Albumin: 4.1 g/dL (ref 4.0–5.0)
Alkaline Phosphatase: 116 IU/L (ref 44–121)
BUN/Creatinine Ratio: 14 (ref 9–23)
BUN: 11 mg/dL (ref 6–20)
Bilirubin Total: <0.2 mg/dL (ref 0.0–1.2)
CO2: 20 mmol/L (ref 20–29)
Calcium: 9.2 mg/dL (ref 8.7–10.2)
Chloride: 106 mmol/L (ref 96–106)
Creatinine, Ser: 0.79 mg/dL (ref 0.57–1.00)
Globulin, Total: 3.2 g/dL (ref 1.5–4.5)
Glucose: 87 mg/dL (ref 70–99)
Potassium: 4.1 mmol/L (ref 3.5–5.2)
Sodium: 140 mmol/L (ref 134–144)
Total Protein: 7.3 g/dL (ref 6.0–8.5)
eGFR: 109 mL/min/{1.73_m2} (ref >59)

## 2022-08-24 LAB — T3: T3, Total: 152 ng/dL (ref 71–180)

## 2022-08-24 LAB — DHEA-SULFATE: DHEA-SO4: 481 ug/dL — ABNORMAL HIGH (ref 110.0–431.7)

## 2022-08-24 LAB — TSH: TSH: 0.4 u[IU]/mL — ABNORMAL LOW (ref 0.450–4.500)

## 2022-08-24 LAB — T4, FREE: Free T4: 1.26 ng/dL (ref 0.82–1.77)

## 2022-08-24 LAB — BETA HCG QUANT (REF LAB): hCG Quant: <1 m[IU]/mL

## 2022-08-29 ENCOUNTER — Ambulatory Visit (INDEPENDENT_AMBULATORY_CARE_PROVIDER_SITE_OTHER): Payer: Medicaid Other

## 2022-08-29 ENCOUNTER — Encounter: Payer: Self-pay | Admitting: Family Medicine

## 2022-08-29 ENCOUNTER — Ambulatory Visit (INDEPENDENT_AMBULATORY_CARE_PROVIDER_SITE_OTHER): Payer: Medicaid Other | Admitting: Family Medicine

## 2022-08-29 VITALS — BP 104/72 | HR 88 | Temp 98.1°F | Resp 18 | Ht 67.0 in | Wt 236.1 lb

## 2022-08-29 DIAGNOSIS — N911 Secondary amenorrhea: Secondary | ICD-10-CM

## 2022-08-29 DIAGNOSIS — E282 Polycystic ovarian syndrome: Secondary | ICD-10-CM

## 2022-08-29 MED ORDER — MEDROXYPROGESTERONE ACETATE 10 MG PO TABS: 10.0000 mg | ORAL_TABLET | Freq: Every day | ORAL | 0 refills | Status: DC

## 2022-08-29 NOTE — Progress Notes (Signed)
   Acute Office Visit  Subjective:     Patient ID: Jill Neal, female    DOB: 21-Sep-2000, 22 y.o.   MRN: [id number]  Chief Complaint  Patient presents with   pcos    Patient is here to talk to PCP regarding PCOS     HPI Patient is in today for follow up of ultrasound  Pt seen here a week ago. Reported amenorrhea for 70 days. Pregnancy test negative but did have elevated DHEA levels. She was then sent for transvaginal ultrasound of which showed PCOS and 10mm endometrium. Pt is yet to have a period. Here to discuss diagnosis and treatment options.    Review of Systems  Endo/Heme/Allergies:        Amenorrhea  All other systems reviewed and are negative.      Objective:    BP 104/72   Pulse 88   Temp 98.1 F (36.7 C) (Oral)   Resp 18   Ht 5\' 7"  (1.702 m)   Wt 236 lb 1.6 oz (107.1 kg)   SpO2 99%   BMI 36.98 kg/m    Physical Exam Vitals and nursing note reviewed.  Constitutional:      Appearance: Normal appearance. She is obese.  HENT:     Head: Normocephalic and atraumatic.     Right Ear: External ear normal.     Left Ear: External ear normal.  Cardiovascular:     Rate and Rhythm: Normal rate.  Pulmonary:     Effort: Pulmonary effort is normal.  Neurological:     General: No focal deficit present.     Mental Status: She is alert and oriented to person, place, and time. Mental status is at baseline.  Psychiatric:        Mood and Affect: Mood normal.        Behavior: Behavior normal.        Thought Content: Thought content normal.        Judgment: Judgment normal.   No results found for any visits on 08/29/22.      Assessment & Plan:   Problem List Items Addressed This Visit   None Visit Diagnoses     PCOS (polycystic ovarian syndrome)    -  Primary   Relevant Medications   medroxyPROGESTERone (PROVERA) 10 MG tablet       Meds ordered this encounter  Medications   medroxyPROGESTERone (PROVERA) 10 MG tablet    Sig: Take 1 tablet (10 mg  total) by mouth daily. For 7-10 days, expect menstruation around Day 7    Dispense:  10 tablet    Refill:  0   PCOS (polycystic ovarian syndrome) -     medroxyPROGESTERone Acetate; Take 1 tablet (10 mg total) by mouth daily. For 7-10 days, expect menstruation around Day 7  Dispense: 10 tablet; Refill: 0   Due to amenorrhea, start with provera 10mg  x 7-10 days in order to start period. Will follow up 1 week. At that time, if period starts, to send and start Metformin 500mg  daily for PCOS Discussed lifestyle changes with patient.  Return in about 8 days (around 09/06/2022) for PCOS.  Suzan Slick, MD

## 2022-09-06 ENCOUNTER — Encounter: Payer: Self-pay | Admitting: Family Medicine

## 2022-09-18 ENCOUNTER — Encounter: Payer: Self-pay | Admitting: Family Medicine

## 2022-09-18 ENCOUNTER — Telehealth (INDEPENDENT_AMBULATORY_CARE_PROVIDER_SITE_OTHER): Payer: Medicaid Other | Admitting: Family Medicine

## 2022-09-18 VITALS — Ht 67.0 in | Wt 238.0 lb

## 2022-09-18 DIAGNOSIS — N911 Secondary amenorrhea: Secondary | ICD-10-CM

## 2022-09-18 DIAGNOSIS — E282 Polycystic ovarian syndrome: Secondary | ICD-10-CM

## 2022-09-18 MED ORDER — MEDROXYPROGESTERONE ACETATE 10 MG PO TABS: 10.0000 mg | ORAL_TABLET | Freq: Every day | ORAL | 0 refills | Status: DC

## 2022-09-18 MED ORDER — METFORMIN HCL ER 500 MG PO TB24: 500.0000 mg | ORAL_TABLET | Freq: Every day | ORAL | 0 refills | Status: DC

## 2022-09-18 NOTE — Progress Notes (Signed)
Virtual Visit via Video Note  I connected with Jill Neal on 09/18/22 at  3:10 PM EDT by a video enabled telemedicine application and verified that I am speaking with the correct person using two identifiers.  Location: Patient: home in [place] Provider: office in [place]   I discussed the limitations of evaluation and management by telemedicine and the availability of in person appointments. The patient expressed understanding and agreed to proceed.  History of Present Illness: Pt is here via virtual visit for follow up of PCOS and amenorrhea. She's had negative serum pregnancy testing done along with TVUS that revealed cysts on ovaries. She was started on trial of Provera x 10 days. This didn't produce a cycle. She is interested in further treatment options.    Observations/Objective: Physical Exam Vitals and nursing note reviewed.  Constitutional:      Appearance: Normal appearance. She is obese.  HENT:     Head: Normocephalic and atraumatic.     Right Ear: External ear normal.     Left Ear: External ear normal.     Nose: Nose normal.  Pulmonary:     Effort: Pulmonary effort is normal.  Neurological:     General: No focal deficit present.     Mental Status: She is alert and oriented to person, place, and time. Mental status is at baseline.  Psychiatric:        Mood and Affect: Mood normal.        Behavior: Behavior normal.        Thought Content: Thought content normal.        Judgment: Judgment normal.     Assessment and Plan:  PCOS (polycystic ovarian syndrome) -     medroxyPROGESTERone Acetate; Take 1 tablet (10 mg total) by mouth daily for 3 days.  Dispense: 3 tablet; Refill: 0 -     metFORMIN HCl ER; Take 1 tablet (500 mg total) by mouth daily with breakfast.  Dispense: 90 tablet; Refill: 0  Amenorrhea, secondary -     medroxyPROGESTERone Acetate; Take 1 tablet (10 mg total) by mouth daily for 3 days.  Dispense: 3 tablet; Refill: 0    Follow Up  Instructions: Due to continued amenorrhea, extend Provera 10mg  x 3 days along with Metformin 500mg  daily. To touch base with me in 1 week. If still no period, will refer to gynecology.   I discussed the assessment and treatment plan with the patient. The patient was provided an opportunity to ask questions and all were answered. The patient agreed with the plan and demonstrated an understanding of the instructions.   The patient was advised to call back or seek an in-person evaluation if the symptoms worsen or if the condition fails to improve as anticipated.  Total time spent with patient today 10 minutes. This includes reviewing records, evaluating the patient and coordinating care. Face-to-face time >50%.    Suzan Slick, MD

## 2022-10-16 ENCOUNTER — Telehealth: Payer: Self-pay

## 2022-10-16 NOTE — Telephone Encounter (Signed)
Called and spoke with patient regarding her questions about the stretch marks, she states that she now remembers that the marks came from her heating pad. Informed patient that I would let provider aware.

## 2022-10-18 ENCOUNTER — Telehealth (INDEPENDENT_AMBULATORY_CARE_PROVIDER_SITE_OTHER): Payer: Medicaid Other | Admitting: Family Medicine

## 2022-10-18 ENCOUNTER — Encounter: Payer: Self-pay | Admitting: Family Medicine

## 2022-10-18 VITALS — Ht 67.0 in

## 2022-10-18 DIAGNOSIS — E282 Polycystic ovarian syndrome: Secondary | ICD-10-CM

## 2022-10-18 DIAGNOSIS — N760 Acute vaginitis: Secondary | ICD-10-CM | POA: Diagnosis not present

## 2022-10-18 MED ORDER — METRONIDAZOLE 500 MG PO TABS: 500.0000 mg | ORAL_TABLET | Freq: Two times a day (BID) | ORAL | 0 refills | Status: AC

## 2022-10-18 MED ORDER — FLUCONAZOLE 150 MG PO TABS: 150.0000 mg | ORAL_TABLET | Freq: Once | ORAL | 0 refills | Status: AC

## 2022-10-18 NOTE — Progress Notes (Signed)
Virtual Visit via Video Note  I connected with Jill Neal on 10/18/22 at  2:30 PM EDT by a video enabled telemedicine application and verified that I am speaking with the correct person using two identifiers.  Location: Patient: Home in [place], [place]  Provider: [facility]   I discussed the limitations of evaluation and management by telemedicine and the availability of in person appointments. The patient expressed understanding and agreed to proceed.  History of Present Illness: Pt has hx of PCOS. Has been followed up with for the last several weeks on her menstrual cycle. Tried Provera 10mg  x 10 days which didn't produce a period. She was seen a few weeks ago and the Provera 10mg  was given to her for 3 more days along with starting Metformin 500mg  daily. She is here for follow up and reports she got her cycle on July 27-August 6th. She also started spotting for the last 2 days but has been light. She denies any sexual intercourse in a while.  She also reports concerns with vaginal discharge, odor and itching. She has clear discharge.    Observations/Objective: Physical Exam Vitals and nursing note reviewed.  Constitutional:      Appearance: Normal appearance. She is obese.  HENT:     Head: Normocephalic and atraumatic.     Right Ear: External ear normal.     Left Ear: External ear normal.     Nose: Nose normal.  Pulmonary:     Effort: Pulmonary effort is normal.  Neurological:     General: No focal deficit present.     Mental Status: She is alert and oriented to person, place, and time. Mental status is at baseline.  Psychiatric:        Mood and Affect: Mood normal.        Behavior: Behavior normal.        Thought Content: Thought content normal.        Judgment: Judgment normal.     Assessment and Plan: PCOS (polycystic ovarian syndrome)  Acute vaginitis -     Fluconazole; Take 1 tablet (150 mg total) by mouth once for 1 dose.  Dispense: 1  tablet; Refill: 0 -     metroNIDAZOLE; Take 1 tablet (500 mg total) by mouth 2 (two) times daily for 7 days.  Dispense: 14 tablet; Refill: 0     Follow Up Instructions: Advised for pt to continue taking the Metformin 500mg  daily for PCOS as she's had a cycle since starting.  Provera has been completed and no longer indicated. For her vaginal symptoms, suspect yeast vs BV. Will give one dose of Diflucan 150mg  x 1 and then to start Flagyl 500mg  BID x 7 days. Advised pt that this could be from her menstrual cycle but if her symptoms continues after treatment, she should be seen in person visit for pelvic exam.   I discussed the assessment and treatment plan with the patient. The patient was provided an opportunity to ask questions and all were answered. The patient agreed with the plan and demonstrated an understanding of the instructions.   The patient was advised to call back or seek an in-person evaluation if the symptoms worsen or if the condition fails to improve as anticipated.  Total time spent with patient today 20 minutes. This includes discussing concerns, evaluating the patient and coordinating care. Face-to-face time >50%.    Suzan Slick, MD

## 2022-11-02 ENCOUNTER — Ambulatory Visit: Payer: Medicaid Other

## 2022-11-02 ENCOUNTER — Ambulatory Visit (HOSPITAL_COMMUNITY): Payer: Medicaid Other

## 2022-11-02 ENCOUNTER — Encounter: Payer: Self-pay | Admitting: Family Medicine

## 2022-11-03 ENCOUNTER — Other Ambulatory Visit: Payer: Self-pay | Admitting: Family Medicine

## 2022-11-03 DIAGNOSIS — N3090 Cystitis, unspecified without hematuria: Secondary | ICD-10-CM

## 2022-11-03 MED ORDER — NITROFURANTOIN MONOHYD MACRO 100 MG PO CAPS
100.0000 mg | ORAL_CAPSULE | Freq: Two times a day (BID) | ORAL | 0 refills | Status: DC
Start: 2022-11-03 — End: 2023-01-02

## 2022-12-13 ENCOUNTER — Ambulatory Visit: Payer: Medicaid Other | Admitting: Family Medicine

## 2022-12-25 ENCOUNTER — Other Ambulatory Visit: Payer: Self-pay | Admitting: Family Medicine

## 2022-12-25 DIAGNOSIS — E282 Polycystic ovarian syndrome: Secondary | ICD-10-CM

## 2023-01-02 ENCOUNTER — Ambulatory Visit (INDEPENDENT_AMBULATORY_CARE_PROVIDER_SITE_OTHER): Payer: Medicaid Other | Admitting: Family Medicine

## 2023-01-02 VITALS — BP 109/73 | HR 98 | Ht 67.0 in | Wt 244.0 lb

## 2023-01-02 DIAGNOSIS — F53 Postpartum depression: Secondary | ICD-10-CM

## 2023-01-02 DIAGNOSIS — F39 Unspecified mood [affective] disorder: Secondary | ICD-10-CM | POA: Diagnosis not present

## 2023-01-02 DIAGNOSIS — R42 Dizziness and giddiness: Secondary | ICD-10-CM | POA: Diagnosis not present

## 2023-01-02 DIAGNOSIS — E282 Polycystic ovarian syndrome: Secondary | ICD-10-CM

## 2023-01-02 MED ORDER — ESCITALOPRAM OXALATE 10 MG PO TABS
10.0000 mg | ORAL_TABLET | Freq: Every day | ORAL | 1 refills | Status: DC
Start: 2023-01-02 — End: 2023-07-27

## 2023-01-02 NOTE — Progress Notes (Signed)
Established Patient Office Visit  Subjective   Patient ID: Jill Neal, female    DOB: [birth year]  Age: 22 y.o. MRN: [id number]  Chief Complaint  Patient presents with   Dizziness    Pcos pt was taking metformin, light work  causes dizziness.    Dizziness    Lightheaded Pt reports this has been present for more than a year but has worsened. She has hx of iron deficiency anemia and was taking MTV with iron supplements. She does take this daily.  Mood disorder Pt reports she has issues with irritability and being withdrawn. She feels like she's insecure about a lot of things. She feels like she doesn't like the weight gain and doesn't like her body. She doesn't feel pretty. She says she feels like she's on survival mode a lot. She says she has financial stressors along with her mother having issues at home as well. She feels like she has a lot of expectations from other people.     Review of Systems  Neurological:  Positive for dizziness.  Psychiatric/Behavioral:  Positive for depression. The patient is nervous/anxious.   All other systems reviewed and are negative.     Objective:     BP 109/73   Pulse 98   Ht 5\' 7"  (1.702 m)   Wt 244 lb (110.7 kg)   SpO2 98%   BMI 38.22 kg/m  BP Readings from Last 3 Encounters:  01/02/23 109/73  08/29/22 104/72  08/23/22 107/71      Physical Exam Vitals and nursing note reviewed.  Constitutional:      Appearance: Normal appearance. She is normal weight.  HENT:     Head: Normocephalic and atraumatic.     Right Ear: External ear normal.     Left Ear: External ear normal.     Nose: Nose normal.     Mouth/Throat:     Mouth: Mucous membranes are moist.     Pharynx: Oropharynx is clear.  Eyes:     Conjunctiva/sclera: Conjunctivae normal.     Pupils: Pupils are equal, round, and reactive to light.  Cardiovascular:     Rate and Rhythm: Normal rate.  Pulmonary:     Effort: Pulmonary effort is normal.  Skin:    General:  Skin is warm.     Capillary Refill: Capillary refill takes less than 2 seconds.  Neurological:     General: No focal deficit present.     Mental Status: She is alert and oriented to person, place, and time. Mental status is at baseline.  Psychiatric:        Mood and Affect: Mood normal.        Behavior: Behavior normal.        Thought Content: Thought content normal.        Judgment: Judgment normal.      No results found for any visits on 01/02/23.     The ASCVD Risk score (Arnett DK, et al., 2019) failed to calculate for the following reasons:   The 2019 ASCVD risk score is only valid for ages 72 to 37    Assessment & Plan:   Problem List Items Addressed This Visit   None  Lightheadedness -     CBC with Differential/Platelet -     Comprehensive metabolic panel -     Hemoglobin A1c -     TSH -     T4, free -     Vitamin B12 -     VITAMIN  D 25 Hydroxy (Vit-D Deficiency, Fractures)  PCOS (polycystic ovarian syndrome) -     CBC with Differential/Platelet -     Comprehensive metabolic panel -     Hemoglobin A1c -     TSH -     T4, free -     Vitamin B12 -     VITAMIN D 25 Hydroxy (Vit-D Deficiency, Fractures)  Mood disorder (HCC) -     Escitalopram Oxalate; Take 1 tablet (10 mg total) by mouth daily.  Dispense: 30 tablet; Refill: 1 -     Ambulatory referral to Behavioral Health  Postpartum depression -     Escitalopram Oxalate; Take 1 tablet (10 mg total) by mouth daily.  Dispense: 30 tablet; Refill: 1 -     Ambulatory referral to Behavioral Health   Pt has been having worsening lightheadedness. Has been on Metformin for PCOS despite normal A1c. Have advised her to stop this for now, Screening labs including CBC as she has hx of iron deficiency anemia.  Pt also asks about referral for mental health counseling. Suspect she has some postpartum depression that never got treated. Will start Lexapro 10mg  daily.  To see back in 2- 3 weeks for follow up.  No follow-ups  on file.    Suzan Slick, MD

## 2023-01-03 ENCOUNTER — Other Ambulatory Visit: Payer: Self-pay | Admitting: Family Medicine

## 2023-01-03 DIAGNOSIS — E559 Vitamin D deficiency, unspecified: Secondary | ICD-10-CM

## 2023-01-03 LAB — CBC WITH DIFFERENTIAL/PLATELET
Basophils Absolute: 0 10*3/uL (ref 0.0–0.2)
Basos: 1 %
EOS (ABSOLUTE): 0 10*3/uL (ref 0.0–0.4)
Eos: 1 %
Hematocrit: 36.5 % (ref 34.0–46.6)
Hemoglobin: 11.3 g/dL (ref 11.1–15.9)
Immature Grans (Abs): 0 10*3/uL (ref 0.0–0.1)
Immature Granulocytes: 0 %
Lymphocytes Absolute: 2.6 10*3/uL (ref 0.7–3.1)
Lymphs: 40 %
MCH: 23.7 pg — ABNORMAL LOW (ref 26.6–33.0)
MCHC: 31 g/dL — ABNORMAL LOW (ref 31.5–35.7)
MCV: 77 fL — ABNORMAL LOW (ref 79–97)
Monocytes Absolute: 0.5 10*3/uL (ref 0.1–0.9)
Monocytes: 7 %
Neutrophils Absolute: 3.4 10*3/uL (ref 1.4–7.0)
Neutrophils: 51 %
Platelets: 464 10*3/uL — ABNORMAL HIGH (ref 150–450)
RBC: 4.77 x10E6/uL (ref 3.77–5.28)
RDW: 14.6 % (ref 11.7–15.4)
WBC: 6.5 10*3/uL (ref 3.4–10.8)

## 2023-01-03 LAB — COMPREHENSIVE METABOLIC PANEL
ALT: 25 [IU]/L (ref 0–32)
AST: 25 [IU]/L (ref 0–40)
Albumin: 4.4 g/dL (ref 4.0–5.0)
Alkaline Phosphatase: 110 [IU]/L (ref 44–121)
BUN/Creatinine Ratio: 19 (ref 9–23)
BUN: 15 mg/dL (ref 6–20)
Bilirubin Total: 0.3 mg/dL (ref 0.0–1.2)
CO2: 21 mmol/L (ref 20–29)
Calcium: 9.5 mg/dL (ref 8.7–10.2)
Chloride: 102 mmol/L (ref 96–106)
Creatinine, Ser: 0.79 mg/dL (ref 0.57–1.00)
Globulin, Total: 3.3 g/dL (ref 1.5–4.5)
Glucose: 89 mg/dL (ref 70–99)
Potassium: 4.3 mmol/L (ref 3.5–5.2)
Sodium: 139 mmol/L (ref 134–144)
Total Protein: 7.7 g/dL (ref 6.0–8.5)
eGFR: 108 mL/min/{1.73_m2} (ref >59)

## 2023-01-03 LAB — HEMOGLOBIN A1C
Est. average glucose Bld gHb Est-mCnc: 117 mg/dL
Hgb A1c MFr Bld: 5.7 % — ABNORMAL HIGH (ref 4.8–5.6)

## 2023-01-03 LAB — VITAMIN D 25 HYDROXY (VIT D DEFICIENCY, FRACTURES): Vit D, 25-Hydroxy: 15.3 ng/mL — ABNORMAL LOW (ref 30.0–100.0)

## 2023-01-03 LAB — VITAMIN B12: Vitamin B-12: 384 pg/mL (ref 232–1245)

## 2023-01-03 LAB — TSH: TSH: 0.642 u[IU]/mL (ref 0.450–4.500)

## 2023-01-03 LAB — T4, FREE: Free T4: 1.27 ng/dL (ref 0.82–1.77)

## 2023-01-03 MED ORDER — VITAMIN D (ERGOCALCIFEROL) 1.25 MG (50000 UNIT) PO CAPS: 50000.0000 [IU] | ORAL_CAPSULE | ORAL | 0 refills | Status: DC

## 2023-01-18 ENCOUNTER — Encounter: Payer: Self-pay | Admitting: Family Medicine

## 2023-02-05 ENCOUNTER — Ambulatory Visit: Payer: Medicaid Other

## 2023-02-07 ENCOUNTER — Ambulatory Visit
Admission: RE | Admit: 2023-02-07 | Discharge: 2023-02-07 | Disposition: A | Discharge status: Home / Self Care | Payer: Medicaid Other | Source: Ambulatory Visit | Attending: Family Medicine | Admitting: Family Medicine

## 2023-02-07 VITALS — BP 164/89 | HR 74 | Temp 98.5°F | Resp 18

## 2023-02-07 DIAGNOSIS — Z202 Contact with and (suspected) exposure to infections with a predominantly sexual mode of transmission: Secondary | ICD-10-CM | POA: Diagnosis present

## 2023-02-07 DIAGNOSIS — R350 Frequency of micturition: Secondary | ICD-10-CM | POA: Diagnosis present

## 2023-02-07 DIAGNOSIS — Z113 Encounter for screening for infections with a predominantly sexual mode of transmission: Secondary | ICD-10-CM | POA: Insufficient documentation

## 2023-02-07 LAB — POCT URINALYSIS DIP (MANUAL ENTRY)
Bilirubin, UA: NEGATIVE
Blood, UA: NEGATIVE
Glucose, UA: NEGATIVE mg/dL
Ketones, POC UA: NEGATIVE mg/dL
Nitrite, UA: NEGATIVE
Protein Ur, POC: NEGATIVE mg/dL
Spec Grav, UA: 1.015 (ref 1.010–1.025)
Urobilinogen, UA: 0.2 U/dL
pH, UA: 7 (ref 5.0–8.0)

## 2023-02-07 MED ORDER — METRONIDAZOLE 500 MG PO TABS: ORAL_TABLET | ORAL | 0 refills | Status: DC

## 2023-02-07 MED ORDER — DOXYCYCLINE HYCLATE 100 MG PO CAPS: 100.0000 mg | ORAL_CAPSULE | Freq: Two times a day (BID) | ORAL | 0 refills | Status: DC

## 2023-02-07 NOTE — ED Provider Notes (Signed)
[facility]   [id number] 02/07/23 Arrival Time: 1542  ASSESSMENT & PLAN:  1. Urinary frequency   2. Screening for STDs (sexually transmitted diseases)   3. STD exposure       Discharge Instructions      You have had labs (urine culture and STI screening) sent today. We will call you with any significant abnormalities or if there is need to begin or change treatment or pursue further follow up.  You may also review your test results online through MyChart. If you do not have a MyChart account, instructions to sign up should be on your discharge paperwork.  I am treating you empirically for chlamydia and trichomoniasis.    Without s/s of PID.  Labs Reviewed & Pending  POCT URINALYSIS DIP (MANUAL ENTRY) - Abnormal; Notable for the following components:      Result Value   Clarity, UA cloudy (*)    Leukocytes, UA Small (1+) (*)    All other components within normal limits  URINE CULTURE  CERVICOVAGINAL ANCILLARY ONLY    Will notify of any positive results. Instructed to refrain from sexual activity for at least seven days.  Reviewed expectations re: course of current medical issues. Questions answered. Outlined signs and symptoms indicating need for more acute intervention. Patient verbalized understanding. After Visit Summary given.   SUBJECTIVE:  Jill Neal is a 22 y.o. female who presents with complaint of slight vaginal discharge and urinary frequency. Few days. Reports sexual partner tested + for chlamydia and trich. Denies fever and pelvic pain.   OBJECTIVE:  Vitals:   02/07/23 1623  BP: (!) 164/89  Pulse: 74  Resp: 18  Temp: 98.5 F (36.9 C)  TempSrc: Oral  SpO2: 98%     General appearance: alert, cooperative, appears stated age and no distress GU: deferred Skin: warm and dry Psychological: alert and cooperative; normal mood and affect.  Results for orders placed or performed during the hospital encounter of 02/07/23  POCT  urinalysis dipstick  Result Value Ref Range   Color, UA yellow yellow   Clarity, UA cloudy (A) clear   Glucose, UA negative negative mg/dL   Bilirubin, UA negative negative   Ketones, POC UA negative negative mg/dL   Spec Grav, UA 9.528 4.132 - 1.025   Blood, UA negative negative   pH, UA 7.0 5.0 - 8.0   Protein Ur, POC negative negative mg/dL   Urobilinogen, UA 0.2 0.2 or 1.0 E.U./dL   Nitrite, UA Negative Negative   Leukocytes, UA Small (1+) (A) Negative    Labs Reviewed  POCT URINALYSIS DIP (MANUAL ENTRY) - Abnormal; Notable for the following components:      Result Value   Clarity, UA cloudy (*)    Leukocytes, UA Small (1+) (*)    All other components within normal limits  URINE CULTURE  CERVICOVAGINAL ANCILLARY ONLY    Allergies  Allergen Reactions   Ciprofloxacin Nausea Only    Past Medical History:  Diagnosis Date   Anemia    Anemia in pregnancy, third trimester 03/06/2019   Hgb 9.8 at 28 week visit Rx for Ferrous Sulfate ordered   Chlamydia    Post-dates pregnancy 05/29/2019   Pregnancy affected by fetal growth restriction 05/29/2019   Short cervical length during pregnancy in second trimester 01/20/2019   2.7 cm on 01/20/19   Supervision of low-risk pregnancy 11/12/2018   BABYSCRIPTS PATIENT: [x ]Initial [x ]12 [x20 [x] 28 [ ] 32 [ ] 36 [ ] 38 [ ] 39 [ ] 40  Nursing Staff Provider Office Location  [facility] Dating   LMP and 6.1 week [place] Language   English Anatomy US   Normal  Flu Vaccine  12/04/2018 Genetic Screen   NIPS: low risk  AFP:   negative  TDaP vaccine   03/05/19 Hgb A1C or  GTT Early 5.2 Third trimester 79-108-93 Rhogam   N/A   LAB RESULTS  Feeding Plan  Bottle Bloo   UTI (lower urinary tract infection)    Family History  Problem Relation Age of Onset   Hypertension Other    Healthy Mother    Healthy Father    Social History   Socioeconomic History   Marital status: Single    Spouse name: Not on file   Number of children: Not on file   Years of education: Not  on file   Highest education level: 12th grade  Occupational History   Not on file  Tobacco Use   Smoking status: Never    Passive exposure: Never   Smokeless tobacco: Never  Vaping Use   Vaping status: Every Day  Substance and Sexual Activity   Alcohol use: Not Currently    Comment: occ   Drug use: Not Currently   Sexual activity: Yes    Birth control/protection: None  Other Topics Concern   Not on file  Social History Narrative   Not on file   Social Determinants of Health   Financial Resource Strain: High Risk (01/02/2023)   Overall Financial Resource Strain (CARDIA)    Difficulty of Paying Living Expenses: Hard  Food Insecurity: Food Insecurity Present (01/02/2023)   Hunger Vital Sign    Worried About Running Out of Food in the Last Year: Sometimes true    Ran Out of Food in the Last Year: Sometimes true  Transportation Needs: No Transportation Needs (01/02/2023)   PRAPARE - Administrator, Civil Service (Medical): No    Lack of Transportation (Non-Medical): No  Physical Activity: Unknown (01/02/2023)   Exercise Vital Sign    Days of Exercise per Week: 0 days    Minutes of Exercise per Session: Not on file  Stress: Stress Concern Present (01/02/2023)   [employer] of Occupational Health - Occupational Stress Questionnaire    Feeling of Stress : To some extent  Social Connections: Socially Isolated (01/02/2023)   Social Connection and Isolation Panel [NHANES]    Frequency of Communication with Friends and Family: Twice a week    Frequency of Social Gatherings with Friends and Family: Never    Attends Religious Services: Never    Database administrator or Organizations: No    Attends Banker Meetings: Not on file    Marital Status: Never married  Intimate Partner Violence: Not on file           [place], MD 02/07/23 1725

## 2023-02-07 NOTE — Discharge Instructions (Addendum)
You have had labs (urine culture and STI screening) sent today. We will call you with any significant abnormalities or if there is need to begin or change treatment or pursue further follow up.  You may also review your test results online through MyChart. If you do not have a MyChart account, instructions to sign up should be on your discharge paperwork.  I am treating you empirically for chlamydia and trichomoniasis.

## 2023-02-07 NOTE — ED Triage Notes (Signed)
Pt presents for STD testing, and UTI testing.   Pt does not want hiv testing.   States she has frequent urination and uncomfortable feeling after voiding.

## 2023-02-08 LAB — CERVICOVAGINAL ANCILLARY ONLY
Chlamydia: NEGATIVE
Comment: NEGATIVE
Comment: NEGATIVE
Comment: NORMAL
Neisseria Gonorrhea: NEGATIVE
Trichomonas: NEGATIVE

## 2023-02-08 LAB — URINE CULTURE

## 2023-02-13 ENCOUNTER — Ambulatory Visit: Payer: Medicaid Other

## 2023-03-02 ENCOUNTER — Ambulatory Visit: Payer: Medicaid Other | Admitting: Family Medicine

## 2023-03-06 ENCOUNTER — Ambulatory Visit: Payer: Medicaid Other | Admitting: Family Medicine

## 2023-03-06 ENCOUNTER — Telehealth: Payer: Self-pay | Admitting: Family Medicine

## 2023-03-06 NOTE — Telephone Encounter (Signed)
 Patient has been dismissed from the practice due to multiple no-show appointments, despite prior warnings.

## 2023-04-04 ENCOUNTER — Ambulatory Visit: Payer: Medicaid Other | Admitting: Family Medicine

## 2023-04-04 NOTE — Progress Notes (Deleted)
 New Patient Office Visit  Subjective    Patient ID: Jill Neal, female    DOB: Feb 18, 2001  Age: 23 y.o. MRN: [id number]  CC: No chief complaint on file.   HPI ORIYA KETTERING presents to establish care ***  Outpatient Encounter Medications as of 04/04/2023  Medication Sig   doxycycline  (VIBRAMYCIN ) 100 MG capsule Take 1 capsule (100 mg total) by mouth 2 (two) times daily.   escitalopram  (LEXAPRO ) 10 MG tablet Take 1 tablet (10 mg total) by mouth daily.   metroNIDAZOLE  (FLAGYL ) 500 MG tablet Take all tablets as a single dose.   NON FORMULARY    Probiotic Product (UP4 PROBIOTICS PO) Take by mouth.   Vitamin D , Ergocalciferol , (DRISDOL ) 1.25 MG (50000 UNIT) CAPS capsule Take 1 capsule (50,000 Units total) by mouth every 7 (seven) days. Take for 8 total doses(weeks)   No facility-administered encounter medications on file as of 04/04/2023.    Past Medical History:  Diagnosis Date   Anemia    Anemia in pregnancy, third trimester 03/06/2019   Hgb 9.8 at 28 week visit Rx for Ferrous Sulfate  ordered   Chlamydia    Post-dates pregnancy 05/29/2019   Pregnancy affected by fetal growth restriction 05/29/2019   Short cervical length during pregnancy in second trimester 01/20/2019   2.7 cm on 01/20/19   Supervision of low-risk pregnancy 11/12/2018   BABYSCRIPTS PATIENT: [x ]Initial [x ]12 [x20 [x] 28 [ ] 32 [ ] 36 [ ] 38 [ ] 39 [ ] 40  Nursing Staff Provider Office Location  [facility] Dating   LMP and 6.1 week US  Language   English Anatomy US    Normal  Flu Vaccine  12/04/2018 Genetic Screen   NIPS: low risk  AFP:   negative  TDaP vaccine   03/05/19 Hgb A1C or  GTT Early 5.2 Third trimester 79-108-93 Rhogam   N/A   LAB RESULTS  Feeding Plan  Bottle Bloo   UTI (lower urinary tract infection)     Past Surgical History:  Procedure Laterality Date   NO PAST SURGERIES      Family History  Problem Relation Age of Onset   Hypertension Other    Healthy Mother    Healthy Father     Social History    Socioeconomic History   Marital status: Single    Spouse name: Not on file   Number of children: Not on file   Years of education: Not on file   Highest education level: 12th grade  Occupational History   Not on file  Tobacco Use   Smoking status: Never    Passive exposure: Never   Smokeless tobacco: Never  Vaping Use   Vaping status: Every Day  Substance and Sexual Activity   Alcohol use: Not Currently    Comment: occ   Drug use: Not Currently   Sexual activity: Yes    Birth control/protection: None  Other Topics Concern   Not on file  Social History Narrative   Not on file   Social Drivers of Health   Financial Resource Strain: High Risk (01/02/2023)   Overall Financial Resource Strain (CARDIA)    Difficulty of Paying Living Expenses: Hard  Food Insecurity: Food Insecurity Present (01/02/2023)   Hunger Vital Sign    Worried About Running Out of Food in the Last Year: Sometimes true    Ran Out of Food in the Last Year: Sometimes true  Transportation Needs: No Transportation Needs (01/02/2023)   PRAPARE - Administrator, Civil Service (Medical):  No    Lack of Transportation (Non-Medical): No  Physical Activity: Unknown (01/02/2023)   Exercise Vital Sign    Days of Exercise per Week: 0 days    Minutes of Exercise per Session: Not on file  Stress: Stress Concern Present (01/02/2023)   [employer] of Occupational Health - Occupational Stress Questionnaire    Feeling of Stress : To some extent  Social Connections: Socially Isolated (01/02/2023)   Social Connection and Isolation Panel [NHANES]    Frequency of Communication with Friends and Family: Twice a week    Frequency of Social Gatherings with Friends and Family: Never    Attends Religious Services: Never    Database Administrator or Organizations: No    Attends Engineer, Structural: Not on file    Marital Status: Never married  Intimate Partner Violence: Not on file    ROS Per HPI       Objective    There were no vitals taken for this visit.  Physical Exam      Assessment & Plan:   There are no diagnoses linked to this encounter.   No follow-ups on file.   Corean Ku, FNP

## 2023-05-16 ENCOUNTER — Other Ambulatory Visit: Payer: Self-pay

## 2023-05-16 ENCOUNTER — Ambulatory Visit
Admission: RE | Admit: 2023-05-16 | Discharge: 2023-05-16 | Disposition: A | Discharge status: Home / Self Care | Source: Ambulatory Visit | Attending: Internal Medicine | Admitting: Internal Medicine

## 2023-05-16 VITALS — BP 119/87 | HR 92 | Temp 98.2°F | Resp 16 | Ht 68.0 in | Wt 245.0 lb

## 2023-05-16 DIAGNOSIS — Z8742 Personal history of other diseases of the female genital tract: Secondary | ICD-10-CM | POA: Diagnosis not present

## 2023-05-16 DIAGNOSIS — N912 Amenorrhea, unspecified: Secondary | ICD-10-CM | POA: Diagnosis not present

## 2023-05-16 LAB — POCT URINALYSIS DIP (MANUAL ENTRY)
Bilirubin, UA: NEGATIVE
Blood, UA: NEGATIVE
Glucose, UA: NEGATIVE mg/dL
Leukocytes, UA: NEGATIVE
Nitrite, UA: NEGATIVE
Spec Grav, UA: >=1.03 — AB (ref 1.010–1.025)
Urobilinogen, UA: 1 U/dL
pH, UA: 6 (ref 5.0–8.0)

## 2023-05-16 LAB — POCT URINE PREGNANCY: Preg Test, Ur: NEGATIVE

## 2023-05-16 MED ORDER — MEDROXYPROGESTERONE ACETATE 10 MG PO TABS: 10.0000 mg | ORAL_TABLET | Freq: Every day | ORAL | 0 refills | Status: DC

## 2023-05-16 NOTE — ED Triage Notes (Signed)
 Patient states "I have PCOS and I don't have a primary care right now, my last period was 10/16. I was on medication before to start my cycle. I wanted to know if it can be refilled." Patient reports abdomen pain x 2 months that comes and goes.

## 2023-05-16 NOTE — ED Provider Notes (Signed)
 [facility]    CSN: [id number] Arrival date & time: 05/16/23  1756      History   Chief Complaint Chief Complaint  Patient presents with   Medication Refill    I haven't had a period in 5 months. I'm concerned and I have PCOS but I need a refill of a medication I was on in order to start it I believe. - Entered by patient    HPI Jill Neal is a 23 y.o. female who presents requesting medication to help her start her period, Provera. Her last period was 10/16/'24. For the past 2 month has had lower central abdominal pain and feels like she will start her period as well as flank pain. But did not start. Denies dysuria or fever Last sexual encounter 12/2022  She does not have a PCP.    Past Medical History:  Diagnosis Date   Anemia    Anemia in pregnancy, third trimester 03/06/2019   Hgb 9.8 at 28 week visit Rx for Ferrous Sulfate ordered   Chlamydia    PCOS (polycystic ovarian syndrome) 07/2022   Post-dates pregnancy 05/29/2019   Pregnancy affected by fetal growth restriction 05/29/2019   Short cervical length during pregnancy in second trimester 01/20/2019   2.7 cm on 01/20/19   Supervision of low-risk pregnancy 11/12/2018   BABYSCRIPTS PATIENT: [x ]Initial [x ]12 [x20 [x] 28 [ ] 32 [ ] 36 [ ] 38 [ ] 39 [ ] 40  Nursing Staff Provider Office Location  [facility] Dating   LMP and 6.1 week [place] Language   English Anatomy US   Normal  Flu Vaccine  12/04/2018 Genetic Screen   NIPS: low risk  AFP:   negative  TDaP vaccine   03/05/19 Hgb A1C or  GTT Early 5.2 Third trimester 79-108-93 Rhogam   N/A   LAB RESULTS  Feeding Plan  Bottle Bloo   UTI (lower urinary tract infection)     Patient Active Problem List   Diagnosis Date Noted   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 Authorizing Provider  medroxyPROGESTERone (PROVERA) 10 MG tablet Take 1 tablet (10 mg total) by mouth daily. 05/16/23  Yes Rodriguez-Southworth, Nettie Elm, PA-C  escitalopram (LEXAPRO) 10 MG tablet Take 1 tablet (10 mg total) by mouth daily. 01/02/23   Suzan Slick, MD  NON FORMULARY     [provider]  Probiotic Product (UP4 PROBIOTICS PO) Take by mouth.    [provider]  Vitamin D, Ergocalciferol, (DRISDOL) 1.25 MG (50000 UNIT) CAPS capsule Take 1 capsule (50,000 Units total) by mouth every 7 (seven) days. Take for 8 total doses(weeks) 01/03/23   Suzan Slick, MD    Family History Family History  Problem Relation Age of Onset   Hypertension Other    Healthy Mother    Healthy Father  Social History Social History   Tobacco Use   Smoking status: Never    Passive exposure: Never   Smokeless tobacco: Never  Vaping Use   Vaping status: Every Day  Substance Use Topics   Alcohol use: Not Currently    Comment: occ   Drug use: Not Currently     Allergies   Ciprofloxacin   Review of Systems Review of Systems As noted in HPI  Physical Exam Triage Vital Signs ED Triage Vitals  Encounter Vitals Group     BP 05/16/23 1852 119/87     Systolic BP Percentile --      Diastolic BP Percentile --      Pulse Rate 05/16/23 1852 92     Resp 05/16/23 1852 16     Temp 05/16/23 1852 98.2 F (36.8 C)     Temp Source 05/16/23 1852 Oral     SpO2 05/16/23 1852 96 %     Weight 05/16/23 1850 245 lb (111.1 kg)     Height 05/16/23 1850 5\' 8"  (1.727 m)     Head Circumference --      Peak Flow --      Pain Score 05/16/23 1850 5     Pain Loc --      Pain Education --      Exclude from Growth Chart --    No data found.  Updated Vital Signs BP 119/87 (BP Location: Right Arm)    Pulse 92   Temp 98.2 F (36.8 C) (Oral)   Resp 16   Ht 5\' 8"  (1.727 m)   Wt 245 lb (111.1 kg)   LMP 12/13/2022 (Exact Date)   SpO2 96%   BMI 37.25 kg/m   Visual Acuity Right Eye Distance:   Left Eye Distance:   Bilateral Distance:    Right Eye Near:   Left Eye Near:    Bilateral Near:     Physical Exam   UC Treatments / Results  Labs (all labs ordered are listed, but only abnormal results are displayed) Labs Reviewed  POCT URINALYSIS DIP (MANUAL ENTRY) - Abnormal; Notable for the following components:      Result Value   Ketones, POC UA trace (5) (*)    Spec Grav, UA >=1.030 (*)    Protein Ur, POC trace (*)    All other components within normal limits  POCT URINE PREGNANCY  Pregnancy test is negative  EKG   Radiology No results found.  Procedures Procedures (including critical care time)  Medications Ordered in UC Medications - No data to display  Initial Impression / Assessment and Plan / UC Course  I have reviewed the triage vital signs and the nursing notes.  Pertinent labs  results that were available during my care of the patient were reviewed by me and considered in my medical decision making (see chart for details).  Amenorrhea Hx of PCOS  I placed her on Proveral 10 mg every day x 3 days.    Final Clinical Impressions(s) / UC Diagnoses   Final diagnoses:  Amenorrhea  History of PCOS   Discharge Instructions   None    ED Prescriptions     Medication Sig Dispense Auth. Provider   medroxyPROGESTERone (PROVERA) 10 MG tablet Take 1 tablet (10 mg total) by mouth daily. 3 tablet Rodriguez-Southworth, Nettie Elm, PA-C      PDMP not reviewed this encounter.   Garey Ham, [place] 05/16/23 1947

## 2023-05-21 ENCOUNTER — Ambulatory Visit
Admission: RE | Admit: 2023-05-21 | Discharge: 2023-05-21 | Disposition: A | Discharge status: Home / Self Care | Source: Ambulatory Visit | Attending: Family Medicine | Admitting: Family Medicine

## 2023-05-21 VITALS — BP 116/83 | HR 90 | Temp 99.4°F | Resp 16

## 2023-05-21 DIAGNOSIS — E282 Polycystic ovarian syndrome: Secondary | ICD-10-CM | POA: Diagnosis present

## 2023-05-21 LAB — POCT URINALYSIS DIP (MANUAL ENTRY)
Blood, UA: NEGATIVE
Glucose, UA: NEGATIVE mg/dL
Leukocytes, UA: NEGATIVE
Nitrite, UA: NEGATIVE
Protein Ur, POC: 30 mg/dL — AB
Spec Grav, UA: 1.025
Urobilinogen, UA: 0.2 U/dL
pH, UA: 6

## 2023-05-21 LAB — POCT URINE PREGNANCY: Preg Test, Ur: NEGATIVE

## 2023-05-21 NOTE — ED Triage Notes (Addendum)
 Pt c/o intermittent lower abd pain x 2 months-is requesting UTI testing and STD testing-states she was seen 3/19 and did not get provera rx-NAD-steady gait

## 2023-05-21 NOTE — ED Provider Notes (Signed)
 [name]  Note:  This document was prepared using Conservation officer, historic buildings and may include unintentional dictation errors.  MRN: [id number] DOB: Oct 01, 2000  Subjective:   Jill Neal is a 23 y.o. female with pmh of PCOS presenting for 2 month history of intermittent lower abdominal/pelvic pain.  Patient was just seen for amenorrhea 05/16/2023.  Was started on Provera to help regulate her cycle as she has not had one in 5 months.  Has not yet started medication because she wanted to come in and be retested for urinary tract infection, vaginal cytology.  Does not want HIV and syphilis testing.  Denies fever, n/v, rashes, dysuria, urinary frequency, hematuria, vaginal discharge.  Does not have a gynecologist.  Plans on moving to a different city very soon.  No current facility-administered medications for this encounter.  Current Outpatient Medications:    escitalopram (LEXAPRO) 10 MG tablet, Take 1 tablet (10 mg total) by mouth daily., Disp: 30 tablet, Rfl: 1   medroxyPROGESTERone (PROVERA) 10 MG tablet, Take 1 tablet (10 mg total) by mouth daily., Disp: 3 tablet, Rfl: 0   NON FORMULARY, , Disp: , Rfl:    Probiotic Product (UP4 PROBIOTICS PO), Take by mouth., Disp: , Rfl:    Vitamin D, Ergocalciferol, (DRISDOL) 1.25 MG (50000 UNIT) CAPS capsule, Take 1 capsule (50,000 Units total) by mouth every 7 (seven) days. Take for 8 total doses(weeks), Disp: 8 capsule, Rfl: 0   Allergies  Allergen Reactions   Ciprofloxacin Nausea Only    Past Medical History:  Diagnosis Date   Anemia    Anemia in pregnancy, third trimester 03/06/2019   Hgb 9.8 at 28 week visit Rx for Ferrous Sulfate ordered   Chlamydia    PCOS (polycystic ovarian syndrome) 07/2022   Post-dates pregnancy 05/29/2019   Pregnancy affected by fetal growth restriction 05/29/2019   Short cervical length during pregnancy in second trimester 01/20/2019   2.7 cm on 01/20/19   Supervision of  low-risk pregnancy 11/12/2018   BABYSCRIPTS PATIENT: [x ]Initial [x ]12 [x20 [x] 28 [ ] 32 [ ] 36 [ ] 38 [ ] 39 [ ] 40  Nursing Staff Provider Office Location  [facility] Dating   LMP and 6.1 week [place] Language   English Anatomy US   Normal  Flu Vaccine  12/04/2018 Genetic Screen   NIPS: low risk  AFP:   negative  TDaP vaccine   03/05/19 Hgb A1C or  GTT Early 5.2 Third trimester 79-108-93 Rhogam   N/A   LAB RESULTS  Feeding Plan  Bottle Bloo   UTI (lower urinary tract infection)      Past Surgical History:  Procedure Laterality Date   NO PAST SURGERIES      Family History  Problem Relation Age of Onset   Hypertension Other    Healthy Mother    Healthy Father     Social History   Tobacco Use   Smoking status: Never    Passive exposure: Never   Smokeless tobacco: Never  Vaping Use   Vaping status: Former  Substance Use Topics   Alcohol use: Not Currently   Drug use: Not Currently    ROS   Objective:   Vitals: BP 116/83 (BP Location: Right Arm)   Pulse 90   Temp 99.4 F (37.4 C) (Oral)   Resp 16   LMP 12/13/2022 (Exact Date)   SpO2 98%   Physical Exam Constitutional:      General: She is not in acute distress.  Appearance: Normal appearance. She is well-developed. She is not ill-appearing, toxic-appearing or diaphoretic.  HENT:     Head: Normocephalic and atraumatic.     Nose: Nose normal.     Mouth/Throat:     Mouth: Mucous membranes are moist.     Pharynx: Oropharynx is clear.  Eyes:     General: No scleral icterus.       Right eye: No discharge.        Left eye: No discharge.     Extraocular Movements: Extraocular movements intact.     Conjunctiva/sclera: Conjunctivae normal.  Cardiovascular:     Rate and Rhythm: Normal rate.  Pulmonary:     Effort: Pulmonary effort is normal.  Abdominal:     General: Bowel sounds are normal. There is no distension.     Palpations: Abdomen is soft. There is no mass.     Tenderness: There is no abdominal tenderness. There is no  right CVA tenderness, left CVA tenderness, guarding or rebound.  Skin:    General: Skin is warm and dry.  Neurological:     General: No focal deficit present.     Mental Status: She is alert and oriented to person, place, and time.  Psychiatric:        Mood and Affect: Mood normal.        Behavior: Behavior normal.        Thought Content: Thought content normal.        Judgment: Judgment normal.     Results for orders placed or performed during the hospital encounter of 05/21/23 (from the past 24 hours)  POCT urinalysis dipstick     Status: Abnormal   Collection Time: 05/21/23  5:14 PM  Result Value Ref Range   Color, UA yellow    Clarity, UA clear    Glucose, UA negative mg/dL   Bilirubin, UA small (A)    Ketones, POC UA trace (5) (A) mg/dL   Spec Grav, UA 1.191    Blood, UA negative    pH, UA 6.0    Protein Ur, POC =30 (A) mg/dL   Urobilinogen, UA 0.2 E.U./dL   Nitrite, UA Negative    Leukocytes, UA Negative   POCT urine pregnancy     Status: Normal   Collection Time: 05/21/23  5:15 PM  Result Value Ref Range   Preg Test, Ur Negative     Assessment and Plan :   PDMP not reviewed this encounter.  1. PCOS (polycystic ovarian syndrome)    Recommended following the treatment regimen as previously outlined at her last visit on 05/16/2023.  Recommend establishing care with a new gynecologist to soon as possible.  Use naproxen as needed for pelvic pain and cramping.  Follow-up with test results tomorrow and treat as appropriate based off of results.  Counseled patient on potential for adverse effects with medications prescribed/recommended today, ER and return-to-clinic precautions discussed, patient verbalized understanding.    Wallis Bamberg, [place] 05/21/23 [phone]

## 2023-05-22 LAB — CERVICOVAGINAL ANCILLARY ONLY
Bacterial Vaginitis (gardnerella): NEGATIVE
Candida Glabrata: NEGATIVE
Candida Vaginitis: NEGATIVE
Chlamydia: NEGATIVE
Comment: NEGATIVE
Comment: NEGATIVE
Comment: NEGATIVE
Comment: NEGATIVE
Comment: NEGATIVE
Comment: NORMAL
Neisseria Gonorrhea: NEGATIVE
Trichomonas: NEGATIVE

## 2023-06-26 ENCOUNTER — Ambulatory Visit
Admission: RE | Admit: 2023-06-26 | Discharge: 2023-06-26 | Disposition: A | Discharge status: Home / Self Care | Source: Ambulatory Visit | Attending: Family Medicine | Admitting: Family Medicine

## 2023-06-26 VITALS — BP 120/83 | HR 95 | Temp 98.9°F | Resp 18

## 2023-06-26 DIAGNOSIS — E282 Polycystic ovarian syndrome: Secondary | ICD-10-CM

## 2023-06-26 LAB — POCT URINALYSIS DIP (MANUAL ENTRY)
Bilirubin, UA: NEGATIVE
Blood, UA: NEGATIVE
Glucose, UA: NEGATIVE mg/dL
Ketones, POC UA: NEGATIVE mg/dL
Leukocytes, UA: NEGATIVE
Nitrite, UA: NEGATIVE
Spec Grav, UA: 1.025 (ref 1.010–1.025)
Urobilinogen, UA: 0.2 U/dL
pH, UA: 6 (ref 5.0–8.0)

## 2023-06-26 LAB — POCT URINE PREGNANCY: Preg Test, Ur: NEGATIVE

## 2023-06-26 NOTE — Discharge Instructions (Addendum)
 Your urine was negative for UTI and urine pregnancy was negative.  Your symptoms are likely being caused by your PCOS.  Please contact gynecology to make an appointment for further workup and treatment of your PCOS.  Please follow-up with your PCP at your scheduled appointment.  Please go to the ER if you develop any worsening symptoms.  Hope you feel better soon!

## 2023-06-26 NOTE — ED Triage Notes (Signed)
 Pt present with c/o intermittent nausea and lower abd cramping and pain. Pt states she has PCOS. Concerned of possible pregnancy.

## 2023-06-26 NOTE — ED Provider Notes (Addendum)
 [facility]    CSN: [id number] Arrival date & time: 06/26/23  2536      History   Chief Complaint Chief Complaint  Patient presents with   Abdominal Pain    Entered by patient   Possible Pregnancy    HPI Jill Neal is a 23 y.o. female resents for abdominal pain.  Patient has a past medical history of PCOS who presents for intermittent lower abdominal cramping and nausea x 1 week.  She denies any fevers, vomiting, diarrhea or constipation, bloating, dysuria, or STD concern.  Does endorse an increase in her normal discharge but denies any itching or smelly discharge.  Does have a history of BV and yeast and states the symptoms are not consistent with that.  She reports her last menstrual cycle was October 2024 and she is not on birth control.  She does not follow with gynecology for her PCOS and does not currently have a PCP.  She has not taken any OTC medications for her symptoms.  No other concerns at this time.   Abdominal Pain Associated symptoms: nausea   Possible Pregnancy Associated symptoms include abdominal pain.    Past Medical History:  Diagnosis Date   Anemia    Anemia in pregnancy, third trimester 03/06/2019   Hgb 9.8 at 28 week visit Rx for Ferrous Sulfate  ordered   Chlamydia    PCOS (polycystic ovarian syndrome) 07/2022   Post-dates pregnancy 05/29/2019   Pregnancy affected by fetal growth restriction 05/29/2019   Short cervical length during pregnancy in second trimester 01/20/2019   2.7 cm on 01/20/19   Supervision of low-risk pregnancy 11/12/2018   BABYSCRIPTS PATIENT: [x ]Initial [x ]12 [x20 [x] 28 [ ] 32 [ ] 36 [ ] 38 [ ] 39 [ ] 40  Nursing Staff Provider Office Location  [facility] Dating   LMP and 6.1 week US  Language   English Anatomy US    Normal  Flu Vaccine  12/04/2018 Genetic Screen   NIPS: low risk  AFP:   negative  TDaP vaccine   03/05/19 Hgb A1C or  GTT Early 5.2 Third trimester 79-108-93 Rhogam   N/A   LAB RESULTS  Feeding Plan  Bottle Bloo    UTI (lower urinary tract infection)     Patient Active Problem List   Diagnosis Date Noted   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 Authorizing Provider  escitalopram  (LEXAPRO ) 10 MG tablet Take 1 tablet (10 mg total) by mouth daily. 01/02/23   Manette Section, MD  medroxyPROGESTERone  (PROVERA ) 10 MG tablet Take 1 tablet (10 mg total) by mouth daily. 05/16/23   Rodriguez-Southworth, Lamond Pilot, PA-C  NON FORMULARY     [provider]  Probiotic Product (UP4 PROBIOTICS PO) Take by mouth.    [provider]  Vitamin D , Ergocalciferol , (DRISDOL ) 1.25 MG (50000 UNIT) CAPS capsule Take  1 capsule (50,000 Units total) by mouth every 7 (seven) days. Take for 8 total doses(weeks) 01/03/23   Manette Section, MD    Family History Family History  Problem Relation Age of Onset   Hypertension Other    Healthy Mother    Healthy Father     Social History Social History   Tobacco Use   Smoking status: Never    Passive exposure: Never   Smokeless tobacco: Never  Vaping Use   Vaping status: Former  Substance Use Topics   Alcohol use: Not Currently   Drug use: Not Currently     Allergies   Ciprofloxacin    Review of Systems Review of Systems  Gastrointestinal:  Positive for abdominal pain and nausea.     Physical Exam Triage Vital Signs ED Triage Vitals  Encounter Vitals Group     BP 06/26/23 0936 120/83     Systolic BP Percentile --      Diastolic BP Percentile --      Pulse Rate 06/26/23 0936 95     Resp 06/26/23 0936 18     Temp 06/26/23 0936 98.9 F (37.2 C)     Temp  Source 06/26/23 0936 Oral     SpO2 06/26/23 0936 98 %     Weight --      Height --      Head Circumference --      Peak Flow --      Pain Score 06/26/23 0935 3     Pain Loc --      Pain Education --      Exclude from Growth Chart --    No data found.  Updated Vital Signs BP 120/83 (BP Location: Right Arm)   Pulse 95   Temp 98.9 F (37.2 C) (Oral)   Resp 18   LMP 12/19/2022 (Exact Date)   SpO2 98%   Visual Acuity Right Eye Distance:   Left Eye Distance:   Bilateral Distance:    Right Eye Near:   Left Eye Near:    Bilateral Near:     Physical Exam Vitals and nursing note reviewed.  Constitutional:      General: She is not in acute distress.    Appearance: Normal appearance. She is obese. She is not ill-appearing.  HENT:     Head: Normocephalic and atraumatic.  Eyes:     Pupils: Pupils are equal, round, and reactive to light.  Cardiovascular:     Rate and Rhythm: Normal rate.  Pulmonary:     Effort: Pulmonary effort is normal.  Abdominal:     General: Bowel sounds are normal.     Palpations: There is no hepatomegaly or splenomegaly.     Tenderness: There is no right CVA tenderness, left CVA tenderness, guarding or rebound. Negative signs include Rovsing's sign and McBurney's sign.     Comments: Abdomen is obese.  Mild tenderness in the right and left lower quadrants without rebound or guarding.  Skin:    General: Skin is warm and dry.  Neurological:     General: No focal deficit present.     Mental Status: She is alert and oriented to person, place, and time.  Psychiatric:        Mood and Affect: Mood normal.        Behavior: Behavior normal.      UC Treatments / Results  Labs (all labs ordered are listed, but only abnormal results are displayed) Labs Reviewed  POCT URINALYSIS DIP (MANUAL ENTRY) -  Abnormal; Notable for the following components:      Result Value   Protein Ur, POC trace (*)    All other components within normal limits  POCT URINE  PREGNANCY   Comprehensive metabolic panel Order: [id number]  Status: Final result     Next appt: None     Dx: Lightheadedness; PCOS (polycystic ova...   Test Result Released: Yes (seen)     Messages: Seen   0 Result Notes     1 Patient Communication       Component Ref Range & Units (hover) 5 mo ago 10 mo ago 4 yr ago 9 yr ago  Glucose 89 87 89 109 High   BUN 15 11 13 12  R  Creatinine, Ser 0.79 0.79 0.72 R 0.73 R  eGFR 108 109    BUN/Creatinine Ratio 19 14    Sodium 139 140 136 R 138 R  Potassium 4.3 4.1 3.6 R 3.2 Low  R  Chloride 102 106 105 R 100 R  CO2 21 20 23  R 23 R  Calcium  9.5 9.2 9.3 R 9.6 R  Total Protein 7.7 7.3 7.9 R   Albumin 4.4 4.1 4.1 R   Globulin, Total 3.3 3.2    Bilirubin Total 0.3 <0.2 0.2 Low  R   Alkaline Phosphatase 110 116 56 R   AST 25 24 24  R   ALT 25 31 16  R   Resulting Agency [facility]         Narrative Performed by: Trenia Fritter Performed at:  [address] [facility] [address] [place], [place]  [id number] Lab Director: Pearlean Botts MD, Phone:  [phone]  Specimen Collected: 01/02/23 16:35 Last Resulted: 01/03/23 05:38   EKG   Radiology No results found.  Procedures Procedures (including critical care time)  Medications Ordered in UC Medications - No data to display  Initial Impression / Assessment and Plan / UC Course  I have reviewed the triage vital signs and the nursing notes.  Pertinent labs & imaging results that were available during my care of the patient were reviewed by me and considered in my medical decision making (see chart for details).     Reviewed exam and symptoms with patient.  No red flags.  Urine and hCG negative.  She declines STD testing.  Discussed symptoms likely secondary to her untreated PCOS.  Highly encouraged her to establish with gynecology, contact information provided.  Nursing staff also was able to set patient up with a new PCP for further workup and overall  health maintenance.  Discussed rest fluids and PCP follow-up at her scheduled appointment.  She should go to the ER for any worsening symptoms that occur prior to her seeing gynecology or PCP, red flags reviewed and she verbalized understanding. Final Clinical Impressions(s) / UC Diagnoses   Final diagnoses:  PCOS (polycystic ovarian syndrome)     Discharge Instructions      Your urine was negative for UTI and urine pregnancy was negative.  Your symptoms are likely being caused by your PCOS.  Please contact gynecology to make an appointment for further workup and treatment of your PCOS.  Please follow-up with your PCP at your scheduled appointment.  Please go to the ER if you develop any worsening symptoms.  Hope you feel better soon!    ED Prescriptions   None    PDMP not reviewed this encounter.   Alleen Arbour, NP 06/26/23 1002    Izabelle Daus, Jodi  R, NP 06/26/23 1003

## 2023-07-14 ENCOUNTER — Telehealth: Admitting: Nurse Practitioner

## 2023-07-14 DIAGNOSIS — R399 Unspecified symptoms and signs involving the genitourinary system: Secondary | ICD-10-CM

## 2023-07-14 MED ORDER — NITROFURANTOIN MONOHYD MACRO 100 MG PO CAPS
100.0000 mg | ORAL_CAPSULE | Freq: Two times a day (BID) | ORAL | 0 refills | Status: AC
Start: 2023-07-14 — End: 2023-07-19

## 2023-07-14 NOTE — Progress Notes (Signed)
 I have spent 5 minutes in review of e-visit questionnaire, review and updating patient chart, medical decision making and response to patient.   Claiborne Rigg, NP

## 2023-07-14 NOTE — Progress Notes (Signed)

## 2023-07-21 ENCOUNTER — Telehealth: Admitting: Family Medicine

## 2023-07-21 DIAGNOSIS — R399 Unspecified symptoms and signs involving the genitourinary system: Secondary | ICD-10-CM

## 2023-07-21 NOTE — Progress Notes (Signed)
  Because Ms. Gaylyn Keas, I feel your condition warrants further evaluation and I recommend that you be seen in a face-to-face visit.  You will need a urine culture done to see what is growing and what antibiotic will be effective since the first one did not work.    NOTE: There will be NO CHARGE for this E-Visit   If you are having a true medical emergency, please call 911.     For an urgent face to face visit, [facility] has multiple urgent care centers for your convenience.  Click the link below for the full list of locations and hours, walk-in wait times, appointment scheduling options and driving directions:  Urgent Care - [place], [place], [place], [place], [place], [place]  [place]     Your MyChart E-visit questionnaire answers were reviewed by a board certified advanced clinical practitioner to complete your personal care plan based on your specific symptoms.    Thank you for using e-Visits.

## 2023-07-23 ENCOUNTER — Other Ambulatory Visit: Payer: Self-pay

## 2023-07-23 ENCOUNTER — Ambulatory Visit
Admission: RE | Admit: 2023-07-23 | Discharge: 2023-07-23 | Disposition: A | Discharge status: Home / Self Care | Source: Ambulatory Visit | Attending: Family Medicine | Admitting: Family Medicine

## 2023-07-23 VITALS — BP 119/81 | HR 113 | Temp 99.0°F | Resp 17

## 2023-07-23 DIAGNOSIS — N3001 Acute cystitis with hematuria: Secondary | ICD-10-CM | POA: Diagnosis present

## 2023-07-23 DIAGNOSIS — Z113 Encounter for screening for infections with a predominantly sexual mode of transmission: Secondary | ICD-10-CM | POA: Diagnosis present

## 2023-07-23 LAB — POCT URINALYSIS DIP (MANUAL ENTRY)
Bilirubin, UA: NEGATIVE
Glucose, UA: NEGATIVE mg/dL
Ketones, POC UA: NEGATIVE mg/dL
Nitrite, UA: NEGATIVE
Spec Grav, UA: 1.01 (ref 1.010–1.025)
Urobilinogen, UA: 0.2 U/dL
pH, UA: 6 (ref 5.0–8.0)

## 2023-07-23 LAB — POCT URINE PREGNANCY: Preg Test, Ur: NEGATIVE

## 2023-07-23 MED ORDER — CEPHALEXIN 500 MG PO CAPS: 500.0000 mg | ORAL_CAPSULE | Freq: Two times a day (BID) | ORAL | 0 refills | Status: DC

## 2023-07-23 NOTE — ED Provider Notes (Signed)
 [facility]    CSN: [id number] Arrival date & time: 07/23/23  1245      History   Chief Complaint Chief Complaint  Patient presents with   Dysuria    HPI Jill Neal is a 23 y.o. female presents for dysuria.  Patient did an e-visit on 5/17 for dysuria and was prescribed 5 days of Macrobid .  States her symptoms improved but when she stopped the medication they return/worsen.  She currently endorses urinary burning, urgency, frequency, hematuria.  Denies fevers, nausea/vomiting, flank pain.  Is requesting STD screening although no known exposure.  States she had some vaginal odor that seems to resolved, no current vaginal discharge.  She has a history of BV and yeast but states the symptoms are not consistent with either.  She has not taken any OTC treatments for her symptoms.  No other concerns at this time.   Dysuria   Past Medical History:  Diagnosis Date   Anemia    Anemia in pregnancy, third trimester 03/06/2019   Hgb 9.8 at 28 week visit Rx for Ferrous Sulfate  ordered   Chlamydia    PCOS (polycystic ovarian syndrome) 07/2022   Post-dates pregnancy 05/29/2019   Pregnancy affected by fetal growth restriction 05/29/2019   Short cervical length during pregnancy in second trimester 01/20/2019   2.7 cm on 01/20/19   Supervision of low-risk pregnancy 11/12/2018   BABYSCRIPTS PATIENT: [x ]Initial [x ]12 [x20 [x] 28 [ ] 32 [ ] 36 [ ] 38 [ ] 39 [ ] 40  Nursing Staff Provider Office Location  [facility] Dating   LMP and 6.1 week US  Language   English Anatomy US    Normal  Flu Vaccine  12/04/2018 Genetic Screen   NIPS: low risk  AFP:   negative  TDaP vaccine   03/05/19 Hgb A1C or  GTT Early 5.2 Third trimester 79-108-93 Rhogam   N/A   LAB RESULTS  Feeding Plan  Bottle Bloo   UTI (lower urinary tract infection)     Patient Active Problem List   Diagnosis Date Noted   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 Authorizing Provider  cephALEXin  (KEFLEX ) 500 MG capsule Take 1 capsule (500 mg total) by mouth 2 (two) times daily for 7 days. 07/23/23 07/30/23 Yes Madox Corkins, Jodi R, NP  escitalopram  (LEXAPRO ) 10 MG tablet Take 1 tablet (10 mg total) by mouth daily. 01/02/23   Manette Section, MD  medroxyPROGESTERone  (PROVERA ) 10 MG tablet Take 1 tablet (10 mg total) by mouth daily. 05/16/23   Rodriguez-Southworth, Lamond Pilot, PA-C  NON FORMULARY     [provider]  Probiotic Product (UP4 PROBIOTICS PO) Take by mouth.    [provider]  Vitamin D , Ergocalciferol , (DRISDOL ) 1.25 MG (50000 UNIT) CAPS capsule Take 1 capsule (50,000 Units total) by mouth every 7 (seven) days. Take for 8 total  doses(weeks) 01/03/23   Manette Section, MD    Family History Family History  Problem Relation Age of Onset   Hypertension Other    Healthy Mother    Healthy Father     Social History Social History   Tobacco Use   Smoking status: Never    Passive exposure: Never   Smokeless tobacco: Never  Vaping Use   Vaping status: Former  Substance Use Topics   Alcohol use: Not Currently   Drug use: Not Currently     Allergies   Ciprofloxacin    Review of Systems Review of Systems  Genitourinary:  Positive for dysuria.     Physical Exam Triage Vital Signs ED Triage Vitals  Encounter Vitals Group     BP 07/23/23 1322 119/81     Systolic BP Percentile --      Diastolic BP Percentile --      Pulse Rate 07/23/23 1322 (!) 113     Resp 07/23/23 1322 17     Temp 07/23/23 1322 99 F (37.2 C)     Temp Source 07/23/23 1322 Oral     SpO2 07/23/23 1322 96 %     Weight --       Height --      Head Circumference --      Peak Flow --      Pain Score 07/23/23 1320 5     Pain Loc --      Pain Education --      Exclude from Growth Chart --    No data found.  Updated Vital Signs BP 119/81   Pulse (!) 113   Temp 99 F (37.2 C) (Oral)   Resp 17   LMP 06/29/2023 (Exact Date)   SpO2 96%   Visual Acuity Right Eye Distance:   Left Eye Distance:   Bilateral Distance:    Right Eye Near:   Left Eye Near:    Bilateral Near:     Physical Exam Vitals and nursing note reviewed.  Constitutional:      Appearance: Normal appearance.  HENT:     Head: Normocephalic and atraumatic.  Eyes:     Pupils: Pupils are equal, round, and reactive to light.  Cardiovascular:     Rate and Rhythm: Normal rate.  Pulmonary:     Effort: Pulmonary effort is normal.  Abdominal:     Tenderness: There is no right CVA tenderness or left CVA tenderness.  Skin:    General: Skin is warm and dry.  Neurological:     General: No focal deficit present.     Mental Status: She is alert and oriented to person, place, and time.  Psychiatric:        Mood and Affect: Mood normal.        Behavior: Behavior normal.      UC Treatments / Results  Labs (all labs ordered are listed, but only abnormal results are displayed) Labs Reviewed  POCT URINALYSIS DIP (MANUAL ENTRY) - Abnormal; Notable for the following components:      Result Value   Color, UA colorless (*)    Clarity, UA cloudy (*)    Blood, UA moderate (*)    Protein Ur, POC trace (*)    Leukocytes, UA Large (3+) (*)    All other components within normal limits  URINE CULTURE  POCT URINE PREGNANCY  CERVICOVAGINAL ANCILLARY ONLY    EKG   Radiology No results found.  Procedures Procedures (including critical care time)  Medications  Ordered in UC Medications - No data to display  Initial Impression / Assessment and Plan / UC Course  I have reviewed the triage vital signs and the nursing notes.  Pertinent labs &  imaging results that were available during my care of the patient were reviewed by me and considered in my medical decision making (see chart for details).     Reviewed exam and symptoms with patient.  No red flags.  Urine positive for UTI, will culture and start Keflex .  STD testing is ordered and will contact for any positive results.  Encouraged rest, fluids, PCP follow-up if symptoms do not improve.  ER precautions reviewed. Final Clinical Impressions(s) / UC Diagnoses   Final diagnoses:  Acute cystitis with hematuria  Screening examination for STD (sexually transmitted disease)     Discharge Instructions      The clinic will contact you with results of the STD testing/urine culture done today if positive.  Start Keflex  twice daily for 7 days.  Lots of rest and fluids.  Please follow-up with your PCP if your symptoms do not improve.  Please go to the ER for any worsening symptoms.  Hope you feel better soon!  ED Prescriptions     Medication Sig Dispense Auth. Provider   cephALEXin  (KEFLEX ) 500 MG capsule Take 1 capsule (500 mg total) by mouth 2 (two) times daily for 7 days. 14 capsule Kyrollos Cordell, Jodi R, NP      PDMP not reviewed this encounter.   Alleen Arbour, NP 07/23/23 1346

## 2023-07-23 NOTE — Discharge Instructions (Addendum)
 The clinic will contact you with results of the STD testing/urine culture done today if positive.  Start Keflex  twice daily for 7 days.  Lots of rest and fluids.  Please follow-up with your PCP if your symptoms do not improve.  Please go to the ER for any worsening symptoms.  Hope you feel better soon!

## 2023-07-23 NOTE — ED Triage Notes (Addendum)
 Pt c/o dysuria and frequent urinationx2wks. Pt states she was placed on atx a week ago and the sx went away, but came back toward the end of taking atx. Pt requesting STI testing and stated, " a lot of things have changed with my health since having sex." Pt states she has had foul smelling vaginal discharge that has since resolved.

## 2023-07-24 LAB — CERVICOVAGINAL ANCILLARY ONLY
Bacterial Vaginitis (gardnerella): NEGATIVE
Candida Glabrata: NEGATIVE
Candida Vaginitis: NEGATIVE
Chlamydia: NEGATIVE
Comment: NEGATIVE
Comment: NEGATIVE
Comment: NEGATIVE
Comment: NEGATIVE
Comment: NEGATIVE
Comment: NORMAL
Neisseria Gonorrhea: NEGATIVE
Trichomonas: NEGATIVE

## 2023-07-24 LAB — URINE CULTURE: Culture: >=100000 — AB

## 2023-07-25 ENCOUNTER — Ambulatory Visit (HOSPITAL_COMMUNITY): Payer: Self-pay

## 2023-07-27 ENCOUNTER — Ambulatory Visit (INDEPENDENT_AMBULATORY_CARE_PROVIDER_SITE_OTHER): Admitting: Nurse Practitioner

## 2023-07-27 ENCOUNTER — Encounter: Payer: Self-pay | Admitting: Nurse Practitioner

## 2023-07-27 VITALS — BP 108/80 | HR 81 | Temp 97.3°F | Ht 68.0 in | Wt 256.2 lb

## 2023-07-27 DIAGNOSIS — F339 Major depressive disorder, recurrent, unspecified: Secondary | ICD-10-CM | POA: Diagnosis not present

## 2023-07-27 DIAGNOSIS — L409 Psoriasis, unspecified: Secondary | ICD-10-CM | POA: Insufficient documentation

## 2023-07-27 DIAGNOSIS — R55 Syncope and collapse: Secondary | ICD-10-CM

## 2023-07-27 DIAGNOSIS — E559 Vitamin D deficiency, unspecified: Secondary | ICD-10-CM | POA: Insufficient documentation

## 2023-07-27 DIAGNOSIS — N390 Urinary tract infection, site not specified: Secondary | ICD-10-CM | POA: Diagnosis not present

## 2023-07-27 DIAGNOSIS — R42 Dizziness and giddiness: Secondary | ICD-10-CM | POA: Insufficient documentation

## 2023-07-27 DIAGNOSIS — E669 Obesity, unspecified: Secondary | ICD-10-CM | POA: Insufficient documentation

## 2023-07-27 DIAGNOSIS — E282 Polycystic ovarian syndrome: Secondary | ICD-10-CM | POA: Diagnosis not present

## 2023-07-27 DIAGNOSIS — D649 Anemia, unspecified: Secondary | ICD-10-CM | POA: Insufficient documentation

## 2023-07-27 DIAGNOSIS — R7989 Other specified abnormal findings of blood chemistry: Secondary | ICD-10-CM | POA: Insufficient documentation

## 2023-07-27 DIAGNOSIS — R7303 Prediabetes: Secondary | ICD-10-CM | POA: Diagnosis not present

## 2023-07-27 MED ORDER — TRIAMCINOLONE ACETONIDE 0.1 % EX CREA: 1.0000 | TOPICAL_CREAM | Freq: Two times a day (BID) | CUTANEOUS | 0 refills | Status: AC

## 2023-07-27 MED ORDER — METFORMIN HCL ER 500 MG PO TB24: 500.0000 mg | ORAL_TABLET | Freq: Every day | ORAL | 1 refills | Status: DC

## 2023-07-27 MED ORDER — SERTRALINE HCL 25 MG PO TABS: 25.0000 mg | ORAL_TABLET | Freq: Every day | ORAL | 1 refills | Status: DC

## 2023-07-27 MED ORDER — SULFAMETHOXAZOLE-TRIMETHOPRIM 800-160 MG PO TABS: 1.0000 | ORAL_TABLET | Freq: Two times a day (BID) | ORAL | 0 refills | Status: DC

## 2023-07-27 NOTE — Patient Instructions (Signed)
 It was great to see you!  We are checking your labs today and will let you know the results via mychart/phone.   Stop keflex  and start bactrim  1 tablet twice a day   Start metformin  XR 1 tablet daily with food after you finish the bactrim   Keep drinking plenty of fluids. Start wearing compression socks during the day. I have placed a referral to cardiology to evaluate you for POTS  I have ordered a steroid cream to use on your ankle and elbow twice a day as needed for dry skin/itching. Start also using moisturizing lotion to these areas.   Stop the lexapro  and start zoloft  1 tablet daily. I have placed a referral to a therapist   The [facility] is open 24/7 and is a walk-in urgent care  [facility] [address], [place], [place] [address] [phone]   Let's follow-up in 4 weeks, sooner if you have concerns.  If a referral was placed today, you will be contacted for an appointment. Please note that routine referrals can sometimes take up to 3-4 weeks to process. Please call our office if you haven't heard anything after this time frame.  Take care,  Rheba Cedar, NP

## 2023-07-27 NOTE — Assessment & Plan Note (Signed)
 Start triamcinolone  cream twice a day to right elbow and left ankle.  She can also use moisturizing lotion like CeraVe daily.

## 2023-07-27 NOTE — Assessment & Plan Note (Signed)
 TSH has been low in the past.  Will check TSH, T4, TPO antibodies, thyroglobulin antibody today.

## 2023-07-27 NOTE — Assessment & Plan Note (Signed)
 She is experiencing urinary tract infection with ongoing symptoms.  She was restarted on Macrobid  and then switched to Keflex .  She is experiencing dysuria, urinary frequency.  Will switch her antibiotic to Bactrim  1 tablet twice a day for 7 days.  Check urine culture today.

## 2023-07-27 NOTE — Assessment & Plan Note (Addendum)
 She experiences lightheadedness, nausea, palpitations when standing for long periods, in the shower.  Orthostatic vital signs today do show heart rate goes up with standing.  Encouraged her to drink plenty of fluids along with electrolytes.  She can wear compression socks during the day.  Referral placed to cardiology for POTS evaluation.  Check CMP, CBC, TSH today.

## 2023-07-27 NOTE — Assessment & Plan Note (Signed)
 She is not currently taking a vitamin D  supplement.  Will check vitamin D  levels and treat based on results.

## 2023-07-27 NOTE — Progress Notes (Signed)
 New Patient Visit  BP 108/80 (BP Location: Left Arm, Patient Position: Sitting, Cuff Size: Normal)   Pulse 81   Temp (!) 97.3 F (36.3 C)   Ht 5\' 8"  (1.727 m)   Wt 256 lb 3.2 oz (116.2 kg)   LMP 06/29/2023 (Exact Date)   SpO2 98%   BMI 38.96 kg/m    Subjective:    Patient ID: Jill Neal, female    DOB: Dec 14, 2000, 23 y.o.   MRN: [id number]  CC: Chief Complaint  Patient presents with   Establish Care    NP. Est. Care, Concerns with PCOS and POTS syndrome, UTI-antibiotic resistance    HPI: Jill Neal is a 23 y.o. female presents for new patient visit to establish care.  Introduced to Publishing rights manager role and practice setting.  All questions answered.  Discussed provider/patient relationship and expectations.  Jill Neal is here to establish care today.  She has a 53-year-old daughter.  She has a history of PCOS.  She states that she was diagnosed last June.  She was taking metformin , however she stopped this when she started getting POTS like symptoms.  She states that her menstrual cycle is very irregular.  She had a menstrual cycle in May, but her 1 prior to that was in October.  She is not currently following with GYN.  She has issues with hair growth on her chin and above her lip.  She has noticed some weight gain recently.  She has been experiencing a UTI that will not go away.  She is experiencing dysuria and urinary frequency.  She denies pelvic pain, pressure, back pain, fever.  She went to urgent care and was prescribed Macrobid .  She states her symptoms got better, however then worsened on day 4 of the antibiotic.  She was switched to Keflex  and feels like her symptoms are not improving at all.  She notes that she has been experiencing lightheadedness, palpitations, feeling like she may pass out especially while in the shower or doing physical activity.  She states this has made her be more sedentary.  She is concerned that she may have POTS.  She denies chest  pain and shortness of breath.  She has a history of depression and was prescribed Lexapro .  She states that she was only taking this once in a while when her symptoms would get really bad because it causes her to clench her jaw.  She denies SI/HI.  She does note that she has been having some mood swings, irritability, and withdrawing from others.  She will sometimes have trouble sleeping and other times sleep too much.  She denies anxiety.     07/27/2023    2:19 PM 08/23/2022    3:54 PM 05/28/2019    2:07 PM 05/22/2019    1:29 PM 04/14/2019    5:27 PM  Depression screen PHQ 2/9  Decreased Interest 3 3 2 3 2   Down, Depressed, Hopeless 2 2 1 1 1   PHQ - 2 Score 5 5 3 4 3   Altered sleeping 3 2 3 2 3   Tired, decreased energy 3 3 3 1 1   Change in appetite 3 3 1  0 2  Feeling bad or failure about yourself  3 3 1 1 2   Trouble concentrating 3 2 0 1 0  Moving slowly or fidgety/restless 1 1 0 0 0  Suicidal thoughts 1 1  0 0  PHQ-9 Score 22 20 11 9 11   Difficult doing work/chores Extremely dIfficult Extremely  dIfficult         07/27/2023    2:20 PM 08/23/2022    3:55 PM 05/28/2019    2:10 PM 05/28/2019    2:08 PM  GAD 7 : Generalized Anxiety Score  Nervous, Anxious, on Edge 1 2 2 2   Control/stop worrying 2 2 2 2   Worry too much - different things 3 3 2 2   Trouble relaxing 1 3 3 3   Restless 0 1 2 2   Easily annoyed or irritable 3 2 3 3   Afraid - awful might happen 1 1 1 1   Total GAD 7 Score 11 14 15 15   Anxiety Difficulty Somewhat difficult Somewhat difficult       Past Medical History:  Diagnosis Date   Anemia    Anemia in pregnancy, third trimester 03/06/2019   Hgb 9.8 at 28 week visit Rx for Ferrous Sulfate  ordered   Chlamydia    Depression    PCOS (polycystic ovarian syndrome) 07/2022   Post-dates pregnancy 05/29/2019   Pregnancy affected by fetal growth restriction 05/29/2019   Short cervical length during pregnancy in second trimester 01/20/2019   2.7 cm on 01/20/19    Supervision of low-risk pregnancy 11/12/2018   BABYSCRIPTS PATIENT: [x ]Initial [x ]12 [x20 [x] 28 [ ] 32 [ ] 36 [ ] 38 [ ] 39 [ ] 40  Nursing Staff Provider Office Location  [facility] Dating   LMP and 6.1 week US  Language   English Anatomy US    Normal  Flu Vaccine  12/04/2018 Genetic Screen   NIPS: low risk  AFP:   negative  TDaP vaccine   03/05/19 Hgb A1C or  GTT Early 5.2 Third trimester 79-108-93 Rhogam   N/A   LAB RESULTS  Feeding Plan  Bottle Bloo   UTI (lower urinary tract infection)     Past Surgical History:  Procedure Laterality Date   NO PAST SURGERIES      Family History  Problem Relation Age of Onset   Hypertension Other    Healthy Mother    Healthy Father      Social History   Tobacco Use   Smoking status: Never    Passive exposure: Never   Smokeless tobacco: Never  Vaping Use   Vaping status: Former  Substance Use Topics   Alcohol use: Not Currently   Drug use: Not Currently    Current Outpatient Medications on File Prior to Visit  Medication Sig Dispense Refill   Probiotic Product (UP4 PROBIOTICS PO) Take by mouth.     No current facility-administered medications on file prior to visit.     Review of Systems  Constitutional:  Positive for fatigue. Negative for fever.  HENT: Negative.    Eyes: Negative.   Respiratory: Negative.    Cardiovascular:  Positive for palpitations (at times).  Gastrointestinal: Negative.   Endocrine: Negative.   Genitourinary:  Positive for dysuria, frequency and menstrual problem. Negative for hematuria and vaginal discharge.  Musculoskeletal: Negative.   Skin:  Positive for rash (right elbow, left ankle).  Neurological:  Positive for light-headedness. Negative for dizziness and headaches.  Psychiatric/Behavioral:  Positive for dysphoric mood and sleep disturbance. The patient is not nervous/anxious.       Objective:     BP 108/80 (BP Location: Left Arm, Patient Position: Sitting, Cuff Size: Normal)   Pulse 81   Temp (!) 97.3 F  (36.3 C)   Ht 5\' 8"  (1.727 m)   Wt 256 lb 3.2 oz (116.2 kg)   LMP 06/29/2023 (Exact Date)  SpO2 98%   BMI 38.96 kg/m   Wt Readings from Last 3 Encounters:  07/27/23 256 lb 3.2 oz (116.2 kg)  05/16/23 245 lb (111.1 kg)  01/02/23 244 lb (110.7 kg)    BP Readings from Last 3 Encounters:  07/27/23 108/80  07/23/23 119/81  06/26/23 120/83    Orthostatic VS for the past 72 hrs (Last 3 readings):  Orthostatic BP Patient Position BP Location Cuff Size Orthostatic Pulse  07/27/23 1422 122/88 Standing Left Arm Large 107  07/27/23 1421 120/88 Sitting Left Arm Large 95  07/27/23 1420 122/80 Supine Left Arm Large 96     Physical Exam Vitals and nursing note reviewed.  Constitutional:      General: She is not in acute distress.    Appearance: Normal appearance.  HENT:     Head: Normocephalic and atraumatic.     Right Ear: Tympanic membrane, ear canal and external ear normal.     Left Ear: Tympanic membrane, ear canal and external ear normal.  Eyes:     Conjunctiva/sclera: Conjunctivae normal.  Cardiovascular:     Rate and Rhythm: Normal rate and regular rhythm.     Pulses: Normal pulses.     Heart sounds: Normal heart sounds.  Pulmonary:     Effort: Pulmonary effort is normal.     Breath sounds: Normal breath sounds.  Abdominal:     Palpations: Abdomen is soft.     Tenderness: There is no abdominal tenderness.  Musculoskeletal:        General: Normal range of motion.     Cervical back: Normal range of motion and neck supple.     Right lower leg: No edema.     Left lower leg: No edema.  Lymphadenopathy:     Cervical: No cervical adenopathy.  Skin:    General: Skin is warm and dry.     Findings: Rash (dry, scaly skin to right elbow, left ankle) present.  Neurological:     General: No focal deficit present.     Mental Status: She is alert and oriented to person, place, and time.     Cranial Nerves: No cranial nerve deficit.     Coordination: Coordination normal.      Gait: Gait normal.  Psychiatric:        Mood and Affect: Mood normal.        Behavior: Behavior normal.        Thought Content: Thought content normal.        Judgment: Judgment normal.        Assessment & Plan:   Problem List Items Addressed This Visit       Cardiovascular and Mediastinum   Postural dizziness with presyncope   She experiences lightheadedness, nausea, palpitations when standing for long periods, in the shower.  Orthostatic vital signs today do show heart rate goes up with standing.  Encouraged her to drink plenty of fluids along with electrolytes.  She can wear compression socks during the day.  Referral placed to cardiology for POTS evaluation.  Check CMP, CBC, TSH today.      Relevant Orders   Comprehensive metabolic panel with GFR   Ambulatory referral to Cardiology     Endocrine   PCOS (polycystic ovarian syndrome)   Chronic, ongoing.  Her menstrual cycles very irregular and she does have abnormal hair growth.  Will have her start metformin  XR 500 mg daily.      Relevant Orders   Lipid panel     Musculoskeletal  and Integument   Psoriasis   Start triamcinolone  cream twice a day to right elbow and left ankle.  She can also use moisturizing lotion like CeraVe daily.        Genitourinary   Recurrent UTI   She is experiencing urinary tract infection with ongoing symptoms.  She was restarted on Macrobid  and then switched to Keflex .  She is experiencing dysuria, urinary frequency.  Will switch her antibiotic to Bactrim  1 tablet twice a day for 7 days.  Check urine culture today.      Relevant Medications   sulfamethoxazole -trimethoprim  (BACTRIM  DS) 800-160 MG tablet   Other Relevant Orders   Urine Culture     Other   Depression, recurrent (HCC) - Primary   Chronic, not controlled.  She was taking Lexapro  occasionally, however this caused jaw clenching.  Will switch her to Zoloft  25 mg daily.  She denies SI/HI.  Will also place referral to a therapist.   Follow-up in 4 to 6 weeks.  Information given on the urgent care behavioral health center in [place].      Relevant Medications   sertraline  (ZOLOFT ) 25 MG tablet   Other Relevant Orders   Ambulatory referral to Psychology   Prediabetes   We are restarting metformin  500 mg daily.  Check A1c today.  Discussed nutrition, exercise.      Relevant Orders   Hemoglobin A1c   Lipid panel   Vitamin D  deficiency   She is not currently taking a vitamin D  supplement.  Will check vitamin D  levels and treat based on results.      Relevant Orders   VITAMIN D  25 Hydroxy (Vit-D Deficiency, Fractures)   Low TSH level   TSH has been low in the past.  Will check TSH, T4, TPO antibodies, thyroglobulin antibody today.      Relevant Orders   TSH   T4, free   Thyroid Peroxidase Antibodies (TPO) (REFL)   Thyroglobulin antibody   Anemia   Last hemoglobin was stable.  Check CBC, iron, ferritin today and treat based on results.      Relevant Orders   Ferritin   CBC with Differential/Platelet   Iron   Obesity (BMI 30-39.9)   BMI 38.9.  Discussed nutrition, exercise.      Relevant Medications   metFORMIN  (GLUCOPHAGE -XR) 500 MG 24 hr tablet     Follow up plan: Return in about 4 weeks (around 08/24/2023) for Depression, Anxiety.  Corleone Biegler A Mario Voong

## 2023-07-27 NOTE — Assessment & Plan Note (Signed)
 Chronic, ongoing.  Her menstrual cycles very irregular and she does have abnormal hair growth.  Will have her start metformin  XR 500 mg daily.

## 2023-07-27 NOTE — Assessment & Plan Note (Signed)
 BMI 38.9.  Discussed nutrition, exercise.

## 2023-07-27 NOTE — Assessment & Plan Note (Signed)
 Last hemoglobin was stable.  Check CBC, iron, ferritin today and treat based on results.

## 2023-07-27 NOTE — Assessment & Plan Note (Addendum)
 Chronic, not controlled.  She was taking Lexapro  occasionally, however this caused jaw clenching.  Will switch her to Zoloft 25 mg daily.  She denies SI/HI.  Will also place referral to a therapist.  Follow-up in 4 to 6 weeks.  Information given on the urgent care behavioral health center in [place].

## 2023-07-27 NOTE — Assessment & Plan Note (Signed)
 We are restarting metformin  500 mg daily.  Check A1c today.  Discussed nutrition, exercise.

## 2023-07-30 LAB — URINE CULTURE
MICRO NUMBER:: 16519933
Result:: NO GROWTH
SPECIMEN QUALITY:: ADEQUATE

## 2023-07-30 LAB — CBC WITH DIFFERENTIAL/PLATELET
Absolute Lymphocytes: 4085 {cells}/uL — ABNORMAL HIGH (ref 850–3900)
Absolute Monocytes: 550 {cells}/uL (ref 200–950)
Basophils Absolute: 17 {cells}/uL (ref 0–200)
Basophils Relative: 0.2 %
Eosinophils Absolute: 86 {cells}/uL (ref 15–500)
Eosinophils Relative: 1 %
HCT: 34.7 % — ABNORMAL LOW (ref 35.0–45.0)
Hemoglobin: 10.6 g/dL — ABNORMAL LOW (ref 11.7–15.5)
MCH: 24.2 pg — ABNORMAL LOW (ref 27.0–33.0)
MCHC: 30.5 g/dL — ABNORMAL LOW (ref 32.0–36.0)
MCV: 79.2 fL — ABNORMAL LOW (ref 80.0–100.0)
MPV: 10.6 fL (ref 7.5–12.5)
Monocytes Relative: 6.4 %
Neutro Abs: 3861 {cells}/uL (ref 1500–7800)
Neutrophils Relative %: 44.9 %
Platelets: 322 10*3/uL (ref 140–400)
RBC: 4.38 10*6/uL (ref 3.80–5.10)
RDW: 13.9 % (ref 11.0–15.0)
Total Lymphocyte: 47.5 %
WBC: 8.6 10*3/uL (ref 3.8–10.8)

## 2023-07-30 LAB — COMPREHENSIVE METABOLIC PANEL WITH GFR
AG Ratio: 1.3 (calc) (ref 1.0–2.5)
ALT: 37 U/L — ABNORMAL HIGH (ref 6–29)
AST: 33 U/L — ABNORMAL HIGH (ref 10–30)
Albumin: 4.2 g/dL (ref 3.6–5.1)
Alkaline phosphatase (APISO): 93 U/L (ref 31–125)
BUN: 11 mg/dL (ref 7–25)
CO2: 25 mmol/L (ref 20–32)
Calcium: 9.1 mg/dL (ref 8.6–10.2)
Chloride: 106 mmol/L (ref 98–110)
Creat: 0.78 mg/dL (ref 0.50–0.96)
Globulin: 3.3 g/dL (ref 1.9–3.7)
Glucose, Bld: 87 mg/dL (ref 65–99)
Potassium: 4.3 mmol/L (ref 3.5–5.3)
Sodium: 139 mmol/L (ref 135–146)
Total Bilirubin: 0.2 mg/dL (ref 0.2–1.2)
Total Protein: 7.5 g/dL (ref 6.1–8.1)
eGFR: 110 mL/min/{1.73_m2} (ref >=60)

## 2023-07-30 LAB — THYROGLOBULIN ANTIBODY: Thyroglobulin Ab: <1 [IU]/mL (ref <=1)

## 2023-07-30 LAB — FERRITIN: Ferritin: 9 ng/mL — ABNORMAL LOW (ref 16–154)

## 2023-07-30 LAB — T4, FREE: Free T4: 1.3 ng/dL (ref 0.8–1.8)

## 2023-07-30 LAB — LIPID PANEL
Cholesterol: 129 mg/dL (ref <200)
HDL: 36 mg/dL — ABNORMAL LOW (ref >=50)
LDL Cholesterol (Calc): 75 mg/dL
Non-HDL Cholesterol (Calc): 93 mg/dL (ref <130)
Total CHOL/HDL Ratio: 3.6 (calc) (ref <5.0)
Triglycerides: 99 mg/dL (ref <150)

## 2023-07-30 LAB — IRON: Iron: 26 ug/dL — ABNORMAL LOW (ref 40–190)

## 2023-07-30 LAB — VITAMIN D 25 HYDROXY (VIT D DEFICIENCY, FRACTURES): Vit D, 25-Hydroxy: 23 ng/mL — ABNORMAL LOW (ref 30–100)

## 2023-07-30 LAB — HEMOGLOBIN A1C
Hgb A1c MFr Bld: 5.7 % — ABNORMAL HIGH (ref <5.7)
Mean Plasma Glucose: 117 mg/dL
eAG (mmol/L): 6.5 mmol/L

## 2023-07-30 LAB — TSH: TSH: 0.84 m[IU]/L

## 2023-07-30 LAB — THYROID PEROXIDASE ANTIBODIES (TPO) (REFL): Thyroperoxidase Ab SerPl-aCnc: 1 [IU]/mL (ref <9)

## 2023-07-31 ENCOUNTER — Ambulatory Visit: Payer: Self-pay | Admitting: Nurse Practitioner

## 2023-08-24 ENCOUNTER — Ambulatory Visit: Admitting: Nurse Practitioner

## 2023-08-29 ENCOUNTER — Ambulatory Visit (INDEPENDENT_AMBULATORY_CARE_PROVIDER_SITE_OTHER): Admitting: Nurse Practitioner

## 2023-08-29 ENCOUNTER — Other Ambulatory Visit (HOSPITAL_COMMUNITY)
Admission: RE | Admit: 2023-08-29 | Discharge: 2023-08-29 | Disposition: A | Discharge status: Home / Self Care | Source: Ambulatory Visit | Attending: Nurse Practitioner | Admitting: Nurse Practitioner

## 2023-08-29 ENCOUNTER — Ambulatory Visit: Payer: Self-pay | Admitting: Nurse Practitioner

## 2023-08-29 ENCOUNTER — Other Ambulatory Visit (HOSPITAL_COMMUNITY): Payer: Self-pay

## 2023-08-29 ENCOUNTER — Encounter: Payer: Self-pay | Admitting: Nurse Practitioner

## 2023-08-29 ENCOUNTER — Telehealth: Payer: Self-pay

## 2023-08-29 VITALS — BP 110/80 | HR 91 | Temp 98.1°F | Ht 68.0 in | Wt 258.2 lb

## 2023-08-29 DIAGNOSIS — F339 Major depressive disorder, recurrent, unspecified: Secondary | ICD-10-CM

## 2023-08-29 DIAGNOSIS — R350 Frequency of micturition: Secondary | ICD-10-CM | POA: Diagnosis not present

## 2023-08-29 DIAGNOSIS — E669 Obesity, unspecified: Secondary | ICD-10-CM

## 2023-08-29 DIAGNOSIS — N898 Other specified noninflammatory disorders of vagina: Secondary | ICD-10-CM | POA: Diagnosis not present

## 2023-08-29 DIAGNOSIS — E282 Polycystic ovarian syndrome: Secondary | ICD-10-CM

## 2023-08-29 LAB — POCT URINALYSIS DIPSTICK
Bilirubin, UA: NEGATIVE
Blood, UA: POSITIVE
Glucose, UA: NEGATIVE
Ketones, UA: NEGATIVE
Nitrite, UA: POSITIVE
Protein, UA: POSITIVE — AB
Spec Grav, UA: 1.025 (ref 1.010–1.025)
Urobilinogen, UA: 0.2 U/dL
pH, UA: 6 (ref 5.0–8.0)

## 2023-08-29 MED ORDER — SULFAMETHOXAZOLE-TRIMETHOPRIM 800-160 MG PO TABS: 1.0000 | ORAL_TABLET | Freq: Two times a day (BID) | ORAL | 0 refills | Status: DC

## 2023-08-29 MED ORDER — WEGOVY 0.25 MG/0.5ML ~~LOC~~ SOAJ: 0.2500 mg | SUBCUTANEOUS | 0 refills | Status: DC

## 2023-08-29 NOTE — Assessment & Plan Note (Signed)
 Chronic, ongoing. Continue metformin  XR 500mg  daily.

## 2023-08-29 NOTE — Assessment & Plan Note (Signed)
 BMI 39.2. She has been exercising routinely - swimming for 6 hours several days a week and limiting portion sizes. She has stopped eating sweets and has increased vegetables and fruits. She is frustrated because she is still gaining weight. Will have her start wegovy 0.25mg  injection weekly. Discussed possible side effects. She is going to continue doing the plate method of eating, making half her plate fruit/veggies, 1/4 protein and 1/4 carbs. She is going to continue swimming at least 150 minutes per week, but may be more. Follow-up in 6 weeks.

## 2023-08-29 NOTE — Assessment & Plan Note (Signed)
 Chronic, improving. Her mental health has improved without Zoloft . Increased outdoor activities and social engagement have been beneficial. Provide therapy referral contact information. Encourage continued outdoor activities and social interactions.

## 2023-08-29 NOTE — Telephone Encounter (Signed)
 Pharmacy Patient Advocate Encounter  Received notification from [facility] Medicaid that Prior Authorization for Wegovy 0.25MG /0.5ML auto-injectors  has been APPROVED from 08/29/23 to 02/25/24. Ran test claim, Copay is $4. This test claim was processed through [facility]- copay amounts may vary at other pharmacies due to pharmacy/plan contracts, or as the patient moves through the different stages of their insurance plan.   PA #/Case ID/Reference #: [id number]

## 2023-08-29 NOTE — Patient Instructions (Signed)
 It was great to see you!  Start Wegovy injection once a week.  Make sure you are drinking plenty of fluids  You can take miralax daily as needed for constipation   Call the therapist to schedule an appointment   Let's follow-up in 6-8 weeks, sooner if you have concerns.  If a referral was placed today, you will be contacted for an appointment. Please note that routine referrals can sometimes take up to 3-4 weeks to process. Please call our office if you haven't heard anything after this time frame.  Take care,  Tinnie Harada, NP

## 2023-08-29 NOTE — Telephone Encounter (Signed)
 Pharmacy Patient Advocate Encounter   Received notification from CoverMyMeds that prior authorization for  [facility] 0.25MG /0.5ML auto-injectors is required/requested.   Insurance verification completed.   The patient is insured through [facility] [place] .   Per test claim: PA required; PA submitted to above mentioned insurance via CoverMyMeds Key/confirmation #/EOC [id number] Status is pending

## 2023-08-29 NOTE — Progress Notes (Signed)
 Established Patient Office Visit  Subjective   Patient ID: NYONNA HARGROVE, female    DOB: [birth year]  Age: 23 y.o. MRN: [id number]  Chief Complaint  Patient presents with   Urinary Frequency    Feel like I'm starting to have UTI symptoms again, irritating but not burning yet; started about 4 days ago   Depression    Follow up; doing better    HPI Discussed the use of AI scribe software for clinical note transcription with the patient, who gave verbal consent to proceed.  History of Present Illness   RHONA FUSILIER is a 23 year old female with PCOS who presents with concerns about weight management and urinary symptoms.  She has started metformin  without side effects and increased physical activity, including swimming and walking. Despite these efforts, she is concerned about weight gain, which she attributes to PCOS. Her diet now includes more protein and fruits, with smaller meals.  She experiences urinary frequency, less severe than during her last UTI, and suspects swimming might contribute. She has clear but excessive vaginal discharge, especially during urination, with a 'tingly feeling' typical of her UTIs. There is no dysuria, back pain, fevers, or nausea.  She has not started Zoloft  but feels mentally better after increasing outdoor activities. She is interested in therapy but has not been contacted due to her phone being off.        ROS See pertinent positives and negatives per HPI.    Objective:     BP 110/80   Pulse 91   Temp 98.1 F (36.7 C)   Ht 5' 8 (1.727 m)   Wt 258 lb 3.2 oz (117.1 kg)   SpO2 97%   BMI 39.26 kg/m  BP Readings from Last 3 Encounters:  08/29/23 110/80  07/27/23 108/80  07/23/23 119/81   Wt Readings from Last 3 Encounters:  08/29/23 258 lb 3.2 oz (117.1 kg)  07/27/23 256 lb 3.2 oz (116.2 kg)  05/16/23 245 lb (111.1 kg)      Physical Exam Vitals and nursing note reviewed.  Constitutional:      General: She is not in  acute distress.    Appearance: Normal appearance.  HENT:     Head: Normocephalic.  Eyes:     Conjunctiva/sclera: Conjunctivae normal.  Cardiovascular:     Rate and Rhythm: Normal rate and regular rhythm.     Pulses: Normal pulses.     Heart sounds: Normal heart sounds.  Pulmonary:     Effort: Pulmonary effort is normal.     Breath sounds: Normal breath sounds.  Abdominal:     Palpations: Abdomen is soft.     Tenderness: There is no abdominal tenderness. There is no right CVA tenderness or left CVA tenderness.  Musculoskeletal:     Cervical back: Normal range of motion.  Skin:    General: Skin is warm.  Neurological:     General: No focal deficit present.     Mental Status: She is alert and oriented to person, place, and time.  Psychiatric:        Mood and Affect: Mood normal.        Behavior: Behavior normal.        Thought Content: Thought content normal.        Judgment: Judgment normal.      Results for orders placed or performed in visit on 08/29/23  POCT urinalysis dipstick  Result Value Ref Range   Color, UA yellow    Clarity,  UA light    Glucose, UA Negative Negative   Bilirubin, UA negative    Ketones, UA negative    Spec Grav, UA 1.025 1.010 - 1.025   Blood, UA positive    pH, UA 6.0 5.0 - 8.0   Protein, UA Positive (A) Negative   Urobilinogen, UA 0.2 0.2 or 1.0 E.U./dL   Nitrite, UA positive    Leukocytes, UA Moderate (2+) (A) Negative   Appearance cloudy    Odor yes        Assessment & Plan:   Problem List Items Addressed This Visit       Endocrine   PCOS (polycystic ovarian syndrome)   Chronic, ongoing. Continue metformin  XR 500mg  daily.         Other   Depression, recurrent (HCC) - Primary   Chronic, improving. Her mental health has improved without Zoloft . Increased outdoor activities and social engagement have been beneficial. Provide therapy referral contact information. Encourage continued outdoor activities and social interactions.        Obesity (BMI 30-39.9)   BMI 39.2. She has been exercising routinely - swimming for 6 hours several days a week and limiting portion sizes. She has stopped eating sweets and has increased vegetables and fruits. She is frustrated because she is still gaining weight. Will have her start wegovy 0.25mg  injection weekly. Discussed possible side effects. She is going to continue doing the plate method of eating, making half her plate fruit/veggies, 1/4 protein and 1/4 carbs. She is going to continue swimming at least 150 minutes per week, but may be more. Follow-up in 6 weeks.       Relevant Medications   Semaglutide-Weight Management (WEGOVY) 0.25 MG/0.5ML SOAJ   Other Visit Diagnoses       Urinary frequency       U/A shows 2+ leukocytes and trace blood. Will start bactrim  1 tablet BID x 7 days. Add on urine culture.   Relevant Orders   POCT urinalysis dipstick (Completed)   Urine Culture     Vaginal discharge       Check swab for BV, yeast, trich, gonorrhea, and chlamydia. Treat based on results.   Relevant Orders   Cervicovaginal ancillary only       Return in about 6 weeks (around 10/10/2023) for weight management .    Tinnie DELENA Harada, NP

## 2023-08-30 LAB — CERVICOVAGINAL ANCILLARY ONLY
Bacterial Vaginitis (gardnerella): NEGATIVE
Candida Glabrata: NEGATIVE
Candida Vaginitis: NEGATIVE
Chlamydia: NEGATIVE
Comment: NEGATIVE
Comment: NEGATIVE
Comment: NEGATIVE
Comment: NEGATIVE
Comment: NEGATIVE
Comment: NORMAL
Neisseria Gonorrhea: NEGATIVE
Trichomonas: NEGATIVE

## 2023-08-31 LAB — URINE CULTURE
MICRO NUMBER:: 16652173
SPECIMEN QUALITY:: ADEQUATE

## 2023-09-02 ENCOUNTER — Encounter: Payer: Self-pay | Admitting: Nurse Practitioner

## 2023-09-03 MED ORDER — ONDANSETRON HCL 4 MG PO TABS: 4.0000 mg | ORAL_TABLET | Freq: Three times a day (TID) | ORAL | 0 refills | Status: DC | PRN

## 2023-09-09 ENCOUNTER — Encounter: Payer: Self-pay | Admitting: Nurse Practitioner

## 2023-09-10 MED ORDER — FLUCONAZOLE 150 MG PO TABS: ORAL_TABLET | ORAL | 0 refills | Status: DC

## 2023-09-13 ENCOUNTER — Ambulatory Visit

## 2023-09-14 ENCOUNTER — Ambulatory Visit
Admission: RE | Admit: 2023-09-14 | Discharge: 2023-09-14 | Disposition: A | Discharge status: Home / Self Care | Source: Ambulatory Visit | Attending: Family Medicine | Admitting: Family Medicine

## 2023-09-14 VITALS — BP 110/80 | HR 123 | Temp 100.1°F | Resp 16

## 2023-09-14 DIAGNOSIS — B9689 Other specified bacterial agents as the cause of diseases classified elsewhere: Secondary | ICD-10-CM | POA: Insufficient documentation

## 2023-09-14 DIAGNOSIS — N760 Acute vaginitis: Secondary | ICD-10-CM | POA: Diagnosis present

## 2023-09-14 DIAGNOSIS — Z113 Encounter for screening for infections with a predominantly sexual mode of transmission: Secondary | ICD-10-CM | POA: Diagnosis present

## 2023-09-14 DIAGNOSIS — N3001 Acute cystitis with hematuria: Secondary | ICD-10-CM | POA: Diagnosis present

## 2023-09-14 LAB — POCT URINALYSIS DIP (MANUAL ENTRY)
Bilirubin, UA: NEGATIVE
Glucose, UA: NEGATIVE mg/dL
Ketones, POC UA: NEGATIVE mg/dL
Nitrite, UA: NEGATIVE
Protein Ur, POC: NEGATIVE mg/dL
Spec Grav, UA: 1.01 (ref 1.010–1.025)
Urobilinogen, UA: 0.2 U/dL
pH, UA: 6.5 (ref 5.0–8.0)

## 2023-09-14 LAB — POCT URINE PREGNANCY: Preg Test, Ur: NEGATIVE

## 2023-09-14 MED ORDER — METRONIDAZOLE 500 MG PO TABS: 500.0000 mg | ORAL_TABLET | Freq: Two times a day (BID) | ORAL | 0 refills | Status: DC

## 2023-09-14 MED ORDER — FLUCONAZOLE 150 MG PO TABS: 150.0000 mg | ORAL_TABLET | ORAL | 0 refills | Status: DC

## 2023-09-14 NOTE — Discharge Instructions (Addendum)
 Please start fluconazole  to address suspected yeast infection from taking antibiotic. Also start metronidazole  for suspected BV infection. Make sure you hydrate very well with plain water and a quantity of 80 ounces of water a day.  Please limit drinks that are considered urinary irritants such as soda, sweet tea, coffee, energy drinks, alcohol.  These can worsen your urinary and genital symptoms but also be the source of them.  I will let you know about your STI and urine culture results through MyChart to see if we need to prescribe or change your antibiotics based off of those results.

## 2023-09-14 NOTE — ED Provider Notes (Signed)
 [name]  Note:  This document was prepared using Conservation officer, historic buildings and may include unintentional dictation errors.  MRN: [id number] DOB: February 05, 2001  Subjective:   Jill Neal is a 23 y.o. female presenting for 1 week history of recurrent malodorous yellowish white vaginal discharge and vaginal itching. Had an urinary tract infection and was treated with Bactrim . Still has mild dysuria at the end of her urination. She would like to get complete STI testing and recheck on her UTI. Chart review from her PCP confirms she did have an UTI, sensitivities to Bactrim  confirmed as well. Has had some BV and yeast infections in the past. She just took 2 doses of fluconazole , last dose was yesterday. No pelvic pain, genital rashes, n/v.   No current facility-administered medications for this encounter.  Current Outpatient Medications:    fluconazole  (DIFLUCAN ) 150 MG tablet, Take 1 tablet today and a second tablet in 3 days if needed for ongoing symptoms., Disp: 2 tablet, Rfl: 0   metFORMIN  (GLUCOPHAGE -XR) 500 MG 24 hr tablet, Take 1 tablet (500 mg total) by mouth daily with breakfast., Disp: 90 tablet, Rfl: 1   ondansetron  (ZOFRAN ) 4 MG tablet, Take 1 tablet (4 mg total) by mouth every 8 (eight) hours as needed., Disp: 30 tablet, Rfl: 0   Probiotic Product (UP4 PROBIOTICS PO), Take by mouth. (Patient not taking: Reported on 08/29/2023), Disp: , Rfl:    Semaglutide -Weight Management (WEGOVY ) 0.25 MG/0.5ML SOAJ, Inject 0.25 mg into the skin once a week., Disp: 2 mL, Rfl: 0   sulfamethoxazole -trimethoprim  (BACTRIM  DS) 800-160 MG tablet, Take 1 tablet by mouth 2 (two) times daily., Disp: 14 tablet, Rfl: 0   triamcinolone  cream (KENALOG ) 0.1 %, Apply 1 Application topically 2 (two) times daily., Disp: 30 g, Rfl: 0   Allergies  Allergen Reactions   Ciprofloxacin  Nausea Only    Past Medical History:  Diagnosis Date   Anemia    Anemia in pregnancy, third  trimester 03/06/2019   Hgb 9.8 at 28 week visit Rx for Ferrous Sulfate  ordered   Chlamydia    Depression    PCOS (polycystic ovarian syndrome) 07/2022   Post-dates pregnancy 05/29/2019   Pregnancy affected by fetal growth restriction 05/29/2019   Short cervical length during pregnancy in second trimester 01/20/2019   2.7 cm on 01/20/19   Supervision of low-risk pregnancy 11/12/2018   BABYSCRIPTS PATIENT: [x ]Initial [x ]12 [x20 [x] 28 [ ] 32 [ ] 36 [ ] 38 [ ] 39 [ ] 40  Nursing Staff Provider Office Location  [facility] Dating   LMP and 6.1 week US  Language   English Anatomy US    Normal  Flu Vaccine  12/04/2018 Genetic Screen   NIPS: low risk  AFP:   negative  TDaP vaccine   03/05/19 Hgb A1C or  GTT Early 5.2 Third trimester 79-108-93 Rhogam   N/A   LAB RESULTS  Feeding Plan  Bottle Bloo   UTI (lower urinary tract infection)      Past Surgical History:  Procedure Laterality Date   NO PAST SURGERIES      Family History  Problem Relation Age of Onset   Hypertension Other    Healthy Mother    Healthy Father     Social History   Tobacco Use   Smoking status: Never    Passive exposure: Never   Smokeless tobacco: Never  Vaping Use   Vaping status: Former  Substance Use Topics   Alcohol use: Not Currently   Drug use:  Not Currently    ROS   Objective:   Vitals: BP 110/80 (BP Location: Right Arm)   Pulse (!) 123   Temp 100.1 F (37.8 C) (Oral)   Resp 16   LMP 08/13/2023 (Exact Date)   SpO2 96%   Physical Exam Constitutional:      General: She is not in acute distress.    Appearance: Normal appearance. She is well-developed. She is not ill-appearing, toxic-appearing or diaphoretic.  HENT:     Head: Normocephalic and atraumatic.     Nose: Nose normal.     Mouth/Throat:     Mouth: Mucous membranes are moist.  Eyes:     General: No scleral icterus.       Right eye: No discharge.        Left eye: No discharge.     Extraocular Movements: Extraocular movements intact.   Cardiovascular:     Rate and Rhythm: Normal rate.  Pulmonary:     Effort: Pulmonary effort is normal.  Skin:    General: Skin is warm and dry.  Neurological:     General: No focal deficit present.     Mental Status: She is alert and oriented to person, place, and time.  Psychiatric:        Mood and Affect: Mood normal.        Behavior: Behavior normal.    Results for orders placed or performed during the hospital encounter of 09/14/23 (from the past 24 hours)  POCT urinalysis dipstick     Status: Abnormal   Collection Time: 09/14/23  9:45 AM  Result Value Ref Range   Color, UA yellow yellow   Clarity, UA hazy (A) clear   Glucose, UA negative negative mg/dL   Bilirubin, UA negative negative   Ketones, POC UA negative negative mg/dL   Spec Grav, UA 8.989 8.989 - 1.025   Blood, UA trace-lysed (A) negative   pH, UA 6.5 5.0 - 8.0   Protein Ur, POC negative negative mg/dL   Urobilinogen, UA 0.2 0.2 or 1.0 E.U./dL   Nitrite, UA Negative Negative   Leukocytes, UA Trace (A) Negative  POCT urine pregnancy     Status: None   Collection Time: 09/14/23  9:45 AM  Result Value Ref Range   Preg Test, Ur Negative Negative    Assessment and Plan :   PDMP not reviewed this encounter.  1. Acute vaginitis   2. Screen for STD (sexually transmitted disease)   3. Acute cystitis with hematuria   4. Bacterial vaginosis    We will treat patient empirically for bacterial vaginosis with Flagyl  and for yeast vaginitis with fluconazole .  Labs pending. Counseled patient on potential for adverse effects with medications prescribed/recommended today, ER and return-to-clinic precautions discussed, patient verbalized understanding.    Christopher Savannah, [place] 09/14/23 [phone]

## 2023-09-14 NOTE — ED Triage Notes (Addendum)
 Pt states she is having foul smelling yellow/white vaginal discharge with vaginal itching for the past week.  States she had a UTI and was taking bactrim  and she is not sure if she has a yeast infection. States she would like to be tested for STD's swab and blood work and would like her urine retested for UTI.

## 2023-09-15 LAB — URINE CULTURE: Culture: 1000 — AB

## 2023-09-15 LAB — RPR: RPR Ser Ql: NONREACTIVE

## 2023-09-15 LAB — HIV ANTIBODY (ROUTINE TESTING W REFLEX): HIV Screen 4th Generation wRfx: NONREACTIVE

## 2023-09-17 ENCOUNTER — Ambulatory Visit (HOSPITAL_COMMUNITY): Payer: Self-pay

## 2023-09-17 LAB — CERVICOVAGINAL ANCILLARY ONLY
Bacterial Vaginitis (gardnerella): POSITIVE — AB
Candida Glabrata: NEGATIVE
Candida Vaginitis: POSITIVE — AB
Chlamydia: NEGATIVE
Comment: NEGATIVE
Comment: NEGATIVE
Comment: NEGATIVE
Comment: NEGATIVE
Comment: NEGATIVE
Comment: NORMAL
Neisseria Gonorrhea: NEGATIVE
Trichomonas: NEGATIVE

## 2023-09-22 ENCOUNTER — Encounter: Payer: Self-pay | Admitting: Nurse Practitioner

## 2023-09-24 MED ORDER — WEGOVY 0.5 MG/0.5ML ~~LOC~~ SOAJ
0.5000 mg | SUBCUTANEOUS | 0 refills | Status: DC
Start: 2023-09-24 — End: 2023-10-22

## 2023-09-24 NOTE — Telephone Encounter (Signed)
 Requesting: Wegovy   Last Visit: 08/29/2023 Next Visit: 10/10/2023 Last Refill: 08/29/2023  Please Advise

## 2023-10-10 ENCOUNTER — Other Ambulatory Visit (HOSPITAL_COMMUNITY)
Admission: RE | Admit: 2023-10-10 | Discharge: 2023-10-10 | Disposition: A | Discharge status: Home / Self Care | Source: Ambulatory Visit | Attending: Nurse Practitioner | Admitting: Nurse Practitioner

## 2023-10-10 ENCOUNTER — Ambulatory Visit (INDEPENDENT_AMBULATORY_CARE_PROVIDER_SITE_OTHER): Admitting: Nurse Practitioner

## 2023-10-10 ENCOUNTER — Encounter: Payer: Self-pay | Admitting: Nurse Practitioner

## 2023-10-10 VITALS — BP 118/84 | HR 94 | Temp 96.8°F | Ht 68.0 in | Wt 243.8 lb

## 2023-10-10 DIAGNOSIS — Z0000 Encounter for general adult medical examination without abnormal findings: Secondary | ICD-10-CM | POA: Insufficient documentation

## 2023-10-10 DIAGNOSIS — E559 Vitamin D deficiency, unspecified: Secondary | ICD-10-CM | POA: Diagnosis not present

## 2023-10-10 DIAGNOSIS — E282 Polycystic ovarian syndrome: Secondary | ICD-10-CM | POA: Diagnosis not present

## 2023-10-10 DIAGNOSIS — Z124 Encounter for screening for malignant neoplasm of cervix: Secondary | ICD-10-CM | POA: Insufficient documentation

## 2023-10-10 DIAGNOSIS — F339 Major depressive disorder, recurrent, unspecified: Secondary | ICD-10-CM

## 2023-10-10 DIAGNOSIS — Z202 Contact with and (suspected) exposure to infections with a predominantly sexual mode of transmission: Secondary | ICD-10-CM

## 2023-10-10 DIAGNOSIS — E669 Obesity, unspecified: Secondary | ICD-10-CM

## 2023-10-10 DIAGNOSIS — R7303 Prediabetes: Secondary | ICD-10-CM | POA: Diagnosis not present

## 2023-10-10 LAB — VITAMIN D 25 HYDROXY (VIT D DEFICIENCY, FRACTURES): VITD: 21.12 ng/mL — ABNORMAL LOW (ref 30.00–100.00)

## 2023-10-10 NOTE — Assessment & Plan Note (Signed)
 She is not currently taking a vitamin D  supplement.  Will check vitamin D  levels and treat based on results.

## 2023-10-10 NOTE — Progress Notes (Signed)
 BP 118/84 (BP Location: Left Arm, Patient Position: Sitting, Cuff Size: Large)   Pulse 94   Temp (!) 96.8 F (36 C)   Ht 5' 8 (1.727 m)   Wt 243 lb 12.8 oz (110.6 kg)   LMP 10/05/2023 (Exact Date)   SpO2 98%   BMI 37.07 kg/m    Subjective:    Patient ID: Jill Neal, female    DOB: [birth year], 23 y.o.   MRN: [id number]  CC: Chief Complaint  Patient presents with   Annual Exam    With lab work, STD testing-recently exposed, Rx Refills    HPI: Jill Neal is a 23 y.o. female presenting on 10/10/2023 for comprehensive medical examination. Current medical complaints include:exposure to STD  She states that at the end of July she was exposed to a partner with syphilis. He found out after the fact and she would like to be tested. She denies any current symptoms. She had STD testing in July that was negative. They did not use condoms.   Depression and Anxiety Screen done today and results listed below:     10/10/2023    2:00 PM 07/27/2023    2:19 PM 08/23/2022    3:54 PM 05/28/2019    2:07 PM 05/22/2019    1:29 PM  Depression screen PHQ 2/9  Decreased Interest 1 3 3 2 3   Down, Depressed, Hopeless 2 2 2 1 1   PHQ - 2 Score 3 5 5 3 4   Altered sleeping 2 3 2 3 2   Tired, decreased energy 1 3 3 3 1   Change in appetite 0 3 3 1  0  Feeling bad or failure about yourself  2 3 3 1 1   Trouble concentrating 0 3 2 0 1  Moving slowly or fidgety/restless 0 1 1 0 0  Suicidal thoughts 0 1 1  0  PHQ-9 Score 8 22 20 11 9   Difficult doing work/chores Very difficult Extremely dIfficult Extremely dIfficult        10/10/2023    2:01 PM 07/27/2023    2:20 PM 08/23/2022    3:55 PM 05/28/2019    2:10 PM  GAD 7 : Generalized Anxiety Score  Nervous, Anxious, on Edge 3 1 2 2   Control/stop worrying 1 2 2 2   Worry too much - different things 2 3 3 2   Trouble relaxing 0 1 3 3   Restless 1 0 1 2  Easily annoyed or irritable 3 3 2 3   Afraid - awful might happen 2 1 1 1   Total GAD 7 Score 12 11  14 15   Anxiety Difficulty Very difficult Somewhat difficult Somewhat difficult     The patient does not have a history of falls. I did not complete a risk assessment for falls. A plan of care for falls was not documented.   Past Medical History:  Past Medical History:  Diagnosis Date   Anemia    Anemia in pregnancy, third trimester 03/06/2019   Hgb 9.8 at 28 week visit Rx for Ferrous Sulfate  ordered   Chlamydia    Depression    PCOS (polycystic ovarian syndrome) 07/2022   Post-dates pregnancy 05/29/2019   Pregnancy affected by fetal growth restriction 05/29/2019   Short cervical length during pregnancy in second trimester 01/20/2019   2.7 cm on 01/20/19   Supervision of low-risk pregnancy 11/12/2018   BABYSCRIPTS PATIENT: [x ]Initial [x ]12 [x20 [x] 28 [ ] 32 [ ] 36 [ ] 38 [ ] 39 [ ] 40  Nursing Staff Provider Office  Location  [facility] Dating   LMP and 6.1 week US  Language   English Anatomy US    Normal  Flu Vaccine  12/04/2018 Genetic Screen   NIPS: low risk  AFP:   negative  TDaP vaccine   03/05/19 Hgb A1C or  GTT Early 5.2 Third trimester 79-108-93 Rhogam   N/A   LAB RESULTS  Feeding Plan  Bottle Bloo   UTI (lower urinary tract infection)     Surgical History:  Past Surgical History:  Procedure Laterality Date   NO PAST SURGERIES      Medications:  Current Outpatient Medications on File Prior to Visit  Medication Sig   metFORMIN  (GLUCOPHAGE -XR) 500 MG 24 hr tablet Take 1 tablet (500 mg total) by mouth daily with breakfast.   Probiotic Product (UP4 PROBIOTICS PO) Take by mouth.   Semaglutide -Weight Management (WEGOVY ) 0.5 MG/0.5ML SOAJ Inject 0.5 mg into the skin once a week.   ondansetron  (ZOFRAN ) 4 MG tablet Take 1 tablet (4 mg total) by mouth every 8 (eight) hours as needed. (Patient not taking: Reported on 10/10/2023)   triamcinolone  cream (KENALOG ) 0.1 % Apply 1 Application topically 2 (two) times daily. (Patient not taking: Reported on 10/10/2023)   No current  facility-administered medications on file prior to visit.    Allergies:  Allergies  Allergen Reactions   Ciprofloxacin  Nausea Only    Social History:  Social History   Socioeconomic History   Marital status: Single    Spouse name: Not on file   Number of children: 1   Years of education: Not on file   Highest education level: GED or equivalent  Occupational History   Not on file  Tobacco Use   Smoking status: Never    Passive exposure: Never   Smokeless tobacco: Never  Vaping Use   Vaping status: Former  Substance and Sexual Activity   Alcohol use: Not Currently   Drug use: Not Currently   Sexual activity: Yes    Birth control/protection: None  Other Topics Concern   Not on file  Social History Narrative   Not on file   Social Drivers of Health   Financial Resource Strain: Low Risk  (10/09/2023)   Overall Financial Resource Strain (CARDIA)    Difficulty of Paying Living Expenses: Not hard at all  Food Insecurity: No Food Insecurity (10/09/2023)   Hunger Vital Sign    Worried About Running Out of Food in the Last Year: Never true    Ran Out of Food in the Last Year: Never true  Transportation Needs: No Transportation Needs (10/09/2023)   PRAPARE - Administrator, Civil Service (Medical): No    Lack of Transportation (Non-Medical): No  Physical Activity: Insufficiently Active (10/09/2023)   Exercise Vital Sign    Days of Exercise per Week: 2 days    Minutes of Exercise per Session: 60 min  Stress: No Stress Concern Present (10/09/2023)   [employer] of Occupational Health - Occupational Stress Questionnaire    Feeling of Stress: Only a little  Social Connections: Socially Isolated (10/09/2023)   Social Connection and Isolation Panel    Frequency of Communication with Friends and Family: More than three times a week    Frequency of Social Gatherings with Friends and Family: More than three times a week    Attends Religious Services: Never    Automotive engineer or Organizations: No    Attends Banker Meetings: Not on file    Marital Status:  Never married  Catering manager Violence: Not on file   Social History   Tobacco Use  Smoking Status Never   Passive exposure: Never  Smokeless Tobacco Never   Social History   Substance and Sexual Activity  Alcohol Use Not Currently    Family History:  Family History  Problem Relation Age of Onset   Hypertension Other    Healthy Mother    Healthy Father     Past medical history, surgical history, medications, allergies, family history and social history reviewed with patient today and changes made to appropriate areas of the chart.   Review of Systems  Constitutional:  Positive for malaise/fatigue. Negative for fever.  HENT: Negative.    Eyes: Negative.   Respiratory: Negative.    Cardiovascular: Negative.   Gastrointestinal: Negative.   Genitourinary: Negative.   Musculoskeletal: Negative.   Skin: Negative.   Neurological: Negative.   Psychiatric/Behavioral: Negative.     All other ROS negative except what is listed above and in the HPI.      Objective:    BP 118/84 (BP Location: Left Arm, Patient Position: Sitting, Cuff Size: Large)   Pulse 94   Temp (!) 96.8 F (36 C)   Ht 5' 8 (1.727 m)   Wt 243 lb 12.8 oz (110.6 kg)   LMP 10/05/2023 (Exact Date)   SpO2 98%   BMI 37.07 kg/m   Wt Readings from Last 3 Encounters:  10/10/23 243 lb 12.8 oz (110.6 kg)  08/29/23 258 lb 3.2 oz (117.1 kg)  07/27/23 256 lb 3.2 oz (116.2 kg)    Physical Exam Vitals and nursing note reviewed. Exam conducted with a chaperone present.  Constitutional:      General: She is not in acute distress.    Appearance: Normal appearance.  HENT:     Head: Normocephalic and atraumatic.     Right Ear: Tympanic membrane, ear canal and external ear normal.     Left Ear: Tympanic membrane, ear canal and external ear normal.  Eyes:     Conjunctiva/sclera: Conjunctivae normal.   Cardiovascular:     Rate and Rhythm: Normal rate and regular rhythm.     Pulses: Normal pulses.     Heart sounds: Normal heart sounds.  Pulmonary:     Effort: Pulmonary effort is normal.     Breath sounds: Normal breath sounds.  Abdominal:     Palpations: Abdomen is soft.     Tenderness: There is no abdominal tenderness.  Genitourinary:    General: Normal vulva.     Exam position: Lithotomy position.     Labia:        Right: No rash, tenderness or lesion.        Left: No rash, tenderness or lesion.      Vagina: Normal.     Cervix: Normal.     Uterus: Normal.      Adnexa: Right adnexa normal and left adnexa normal.  Musculoskeletal:        General: Normal range of motion.     Cervical back: Normal range of motion and neck supple.     Right lower leg: No edema.     Left lower leg: No edema.  Lymphadenopathy:     Cervical: No cervical adenopathy.  Skin:    General: Skin is warm and dry.  Neurological:     General: No focal deficit present.     Mental Status: She is alert and oriented to person, place, and time.  Cranial Nerves: No cranial nerve deficit.     Coordination: Coordination normal.     Gait: Gait normal.  Psychiatric:        Mood and Affect: Mood normal.        Behavior: Behavior normal.        Thought Content: Thought content normal.        Judgment: Judgment normal.     Results for orders placed or performed during the hospital encounter of 09/14/23  Cervicovaginal ancillary only   Collection Time: 09/14/23  8:49 AM  Result Value Ref Range   Neisseria Gonorrhea Negative    Chlamydia Negative    Trichomonas Negative    Bacterial Vaginitis (gardnerella) Positive (A)    Candida Vaginitis Positive (A)    Candida Glabrata Negative    Comment Normal Reference Range Candida Species - Negative    Comment Normal Reference Range Candida Galbrata - Negative    Comment Normal Reference Range Trichomonas - Negative    Comment      Normal Reference Range  Bacterial Vaginosis - Negative   Comment Normal Reference Ranger Chlamydia - Negative    Comment      Normal Reference Range Neisseria Gonorrhea - Negative  HIV Antibody (routine testing w rflx)   Collection Time: 09/14/23  9:09 AM  Result Value Ref Range   HIV Screen 4th Generation wRfx Non Reactive Non Reactive  RPR   Collection Time: 09/14/23  9:09 AM  Result Value Ref Range   RPR Ser Ql Non Reactive Non Reactive   Interpretation: Comment   Urine Culture   Collection Time: 09/14/23  9:45 AM   Specimen: Urine, Clean Catch  Result Value Ref Range   Specimen Description URINE, CLEAN CATCH    Special Requests NONE    Culture (A)     1,000 COLONIES/mL GROUP B STREP(S.AGALACTIAE)ISOLATED TESTING AGAINST S. AGALACTIAE NOT ROUTINELY PERFORMED DUE TO PREDICTABILITY OF AMP/PEN/VAN SUSCEPTIBILITY. Performed at [facility], [address] [place], [place] [address]    Report Status 09/15/2023 FINAL   POCT urinalysis dipstick   Collection Time: 09/14/23  9:45 AM  Result Value Ref Range   Color, UA yellow yellow   Clarity, UA hazy (A) clear   Glucose, UA negative negative mg/dL   Bilirubin, UA negative negative   Ketones, POC UA negative negative mg/dL   Spec Grav, UA 8.989 8.989 - 1.025   Blood, UA trace-lysed (A) negative   pH, UA 6.5 5.0 - 8.0   Protein Ur, POC negative negative mg/dL   Urobilinogen, UA 0.2 0.2 or 1.0 E.U./dL   Nitrite, UA Negative Negative   Leukocytes, UA Trace (A) Negative  POCT urine pregnancy   Collection Time: 09/14/23  9:45 AM  Result Value Ref Range   Preg Test, Ur Negative Negative      Assessment & Plan:   Problem List Items Addressed This Visit       Endocrine   PCOS (polycystic ovarian syndrome)   Chronic, improving. Continue metformin  XR 500mg  daily. She has noticed some improvement in skin and PCOS symptoms with Wegovy  as well.         Other   Depression, recurrent (HCC)   Chronic, improving. Her mental health has improved  without Zoloft . Increased outdoor activities and social engagement have been beneficial. Provide therapy referral contact information. Encourage continued outdoor activities and social interactions.       Prediabetes   Chronic, stable. Continue metformin  XR 500mg  daily.  Vitamin D  deficiency   She is not currently taking a vitamin D  supplement.  Will check vitamin D  levels and treat based on results.      Relevant Orders   VITAMIN D  25 Hydroxy (Vit-D Deficiency, Fractures)   Obesity (BMI 30-39.9)   She has lost 15 pounds since her last visit and is tolerating the Wegovy  well. Continue wegovy  0.5mg  weekly and will increase to 1mg  at next refill. Continue focus on smaller portion sizes and exercise. Follow-up in 3 months.       Routine general medical examination at a health care facility - Primary   Health maintenance reviewed and updated. Discussed nutrition, exercise. Check CMP, CBC today. Follow-up 1 year.        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 Check gonorrhea, chlamydia, syphilis, and HIV today.   Relevant Orders   HIV Antibody (routine testing w rflx)   RPR     Screening for cervical cancer       Pap with STD testing done today   Relevant Orders   Cytology - PAP        Follow up plan: Return in about 3 months (around 01/10/2024) for weight management .   LABORATORY TESTING:  - Pap smear: pap done  IMMUNIZATIONS:   - Tdap: Tetanus vaccination status reviewed: last tetanus booster within 10 years. - Influenza: Postponed to flu season - Pneumovax: Not applicable - Prevnar: Not applicable - HPV: Declined - Shingrix vaccine: Not applicable  SCREENING: -Mammogram: Not applicable  - Colonoscopy: Not applicable  - Bone Density: Not applicable   PATIENT COUNSELING:   Advised to take 1 mg of folate supplement per day if capable of pregnancy.    Sexuality: Discussed sexually transmitted diseases, partner selection, use of condoms, avoidance of unintended pregnancy  and contraceptive alternatives.   Advised to avoid cigarette smoking.  I discussed with the patient that most people either abstain from alcohol or drink within safe limits (<=14/week and <=4 drinks/occasion for males, <=7/weeks and <= 3 drinks/occasion for females) and that the risk for alcohol disorders and other health effects rises proportionally with the number of drinks per week and how often a drinker exceeds daily limits.  Discussed cessation/primary prevention of drug use and availability of treatment for abuse.   Diet: Encouraged to adjust caloric intake to maintain  or achieve ideal body weight, to reduce intake of dietary saturated fat and total fat, to limit sodium intake by avoiding high sodium foods and not adding table salt, and to maintain adequate dietary potassium and calcium  preferably from fresh fruits, vegetables, and low-fat dairy products.    stressed the importance of regular exercise  Injury prevention: Discussed safety belts, safety helmets, smoke detector, smoking near bedding or upholstery.   Dental health: Discussed importance of regular tooth brushing, flossing, and dental visits.    NEXT PREVENTATIVE PHYSICAL DUE IN 1 YEAR. Return in about 3 months (around 01/10/2024) for weight management .  Dewana Ammirati A Mary-Anne Polizzi

## 2023-10-10 NOTE — Assessment & Plan Note (Signed)
 Chronic, improving. Continue metformin  XR 500mg  daily. She has noticed some improvement in skin and PCOS symptoms with Wegovy  as well.

## 2023-10-10 NOTE — Assessment & Plan Note (Signed)
 Chronic, stable.  Continue metformin XR 500mg  daily.

## 2023-10-10 NOTE — Assessment & Plan Note (Signed)
 She has lost 15 pounds since her last visit and is tolerating the Wegovy  well. Continue wegovy  0.5mg  weekly and will increase to 1mg  at next refill. Continue focus on smaller portion sizes and exercise. Follow-up in 3 months.

## 2023-10-10 NOTE — Assessment & Plan Note (Signed)
 Chronic, improving. Her mental health has improved without Zoloft . Increased outdoor activities and social engagement have been beneficial. Provide therapy referral contact information. Encourage continued outdoor activities and social interactions.

## 2023-10-10 NOTE — Assessment & Plan Note (Signed)
 Health maintenance reviewed and updated. Discussed nutrition, exercise. Check CMP, CBC today. Follow-up 1 year.

## 2023-10-10 NOTE — Patient Instructions (Signed)
 It was great to see you!  Start naproxen  twice a day as needed for chest pain with food   We are checking your labs today and will let you know the results via mychart/phone.   Keep up the great work!   Let's follow-up in 3 months, sooner if you have concerns.  If a referral was placed today, you will be contacted for an appointment. Please note that routine referrals can sometimes take up to 3-4 weeks to process. Please call our office if you haven't heard anything after this time frame.  Take care,  Tinnie Harada, NP

## 2023-10-11 ENCOUNTER — Ambulatory Visit: Payer: Self-pay | Admitting: Nurse Practitioner

## 2023-10-11 DIAGNOSIS — Z202 Contact with and (suspected) exposure to infections with a predominantly sexual mode of transmission: Secondary | ICD-10-CM

## 2023-10-11 LAB — RPR: RPR Ser Ql: NONREACTIVE

## 2023-10-11 LAB — HIV ANTIBODY (ROUTINE TESTING W REFLEX): HIV 1&2 Ab, 4th Generation: NONREACTIVE

## 2023-10-12 LAB — CYTOLOGY - PAP
Adequacy: ABSENT
Chlamydia: NEGATIVE
Comment: NEGATIVE
Comment: NEGATIVE
Comment: NORMAL
Neisseria Gonorrhea: NEGATIVE
Trichomonas: NEGATIVE

## 2023-10-20 ENCOUNTER — Encounter: Payer: Self-pay | Admitting: Nurse Practitioner

## 2023-10-22 MED ORDER — WEGOVY 1 MG/0.5ML ~~LOC~~ SOAJ
1.0000 mg | SUBCUTANEOUS | 0 refills | Status: DC
Start: 2023-10-22 — End: 2023-11-20

## 2023-10-22 NOTE — Telephone Encounter (Signed)
 Requesting: Wegovy  request to increase to 1mg  Last Visit: 10/10/2023 Next Visit: Visit date not found Last Refill: 09/24/2023 of Wegovy  0.5mg   Please Advise

## 2023-11-20 MED ORDER — WEGOVY 1.7 MG/0.75ML ~~LOC~~ SOAJ: 1.7000 mg | SUBCUTANEOUS | 0 refills | Status: DC

## 2023-11-20 MED ORDER — ONDANSETRON HCL 4 MG PO TABS: 4.0000 mg | ORAL_TABLET | Freq: Three times a day (TID) | ORAL | 0 refills | Status: DC | PRN

## 2023-11-20 NOTE — Addendum Note (Signed)
 Addended by: Arlethia Basso A on: 11/20/2023 12:19 PM   Modules accepted: Orders

## 2023-11-20 NOTE — Telephone Encounter (Signed)
 Requesting: Wegovy  1mg  and request an increase Last Visit: 10/10/2023 Next Visit: Visit date not found Last Refill: 10/22/2023  Please Advise

## 2023-11-21 ENCOUNTER — Other Ambulatory Visit: Payer: Self-pay | Admitting: Nurse Practitioner

## 2023-11-21 DIAGNOSIS — E669 Obesity, unspecified: Secondary | ICD-10-CM

## 2023-11-21 NOTE — Telephone Encounter (Signed)
 Requesting: WEGOVY  1.7MG /0.75ML INJ ( 4 PENS)  Last Visit: 10/10/2023 Next Visit: Visit date not found Last Refill: 11/20/2023  Please Advise

## 2023-11-21 NOTE — Telephone Encounter (Signed)
 Copied from CRM #[id number]. Topic: Clinical - Prescription Issue >> Nov 21, 2023  4:13 PM [name] HERO wrote: Reason for CRM: Patient calling because (WEGOVY ) was not approved per the pharmacy.

## 2023-11-23 ENCOUNTER — Telehealth: Payer: Self-pay

## 2023-11-23 ENCOUNTER — Other Ambulatory Visit (HOSPITAL_COMMUNITY): Payer: Self-pay

## 2023-11-23 MED ORDER — WEGOVY 1.7 MG/0.75ML ~~LOC~~ SOAJ: 1.7000 mg | SUBCUTANEOUS | 0 refills | Status: DC

## 2023-11-23 NOTE — Telephone Encounter (Signed)
 Rx resent to pharmacy

## 2023-11-23 NOTE — Addendum Note (Signed)
 Addended by: GLADIS CLAUDENE GRATE Y on: 11/23/2023 02:41 PM   Modules accepted: Orders

## 2023-11-23 NOTE — Telephone Encounter (Signed)
 Request:      Determination: Rx will need to be resent to pts pharmacy and Authorization is on file until 9.30.25 when Medicaid Implements new changes

## 2023-12-17 ENCOUNTER — Ambulatory Visit

## 2023-12-18 ENCOUNTER — Ambulatory Visit
Admission: EM | Admit: 2023-12-18 | Discharge: 2023-12-18 | Disposition: A | Discharge status: Home / Self Care | Attending: Family Medicine | Admitting: Family Medicine

## 2023-12-18 DIAGNOSIS — Z202 Contact with and (suspected) exposure to infections with a predominantly sexual mode of transmission: Secondary | ICD-10-CM | POA: Diagnosis present

## 2023-12-18 DIAGNOSIS — R112 Nausea with vomiting, unspecified: Secondary | ICD-10-CM | POA: Insufficient documentation

## 2023-12-18 DIAGNOSIS — Z113 Encounter for screening for infections with a predominantly sexual mode of transmission: Secondary | ICD-10-CM | POA: Insufficient documentation

## 2023-12-18 LAB — POCT URINE PREGNANCY: Preg Test, Ur: NEGATIVE

## 2023-12-18 MED ORDER — ONDANSETRON 4 MG PO TBDP: 4.0000 mg | ORAL_TABLET | Freq: Three times a day (TID) | ORAL | 0 refills | Status: AC | PRN

## 2023-12-18 NOTE — Discharge Instructions (Addendum)
 Will let you know about your test results tomorrow and get you treated for any infections you test positive for.

## 2023-12-18 NOTE — ED Provider Notes (Signed)
 [facility]  Note:  This document was prepared using Conservation officer, historic buildings and may include unintentional dictation errors.  MRN: [id number] DOB: May 02, 2000  Subjective:   Jill Neal is a 23 y.o. female presenting for complete STI testing.  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  Previously tested negative and would like to be rechecked to make sure.  Patient has also been nauseated and having vomiting the past 2 to 3 days.  LMP was a month ago.  She is due for her cycle tomorrow.  Denies fever, n/v, abdominal pain, pelvic pain, rashes, dysuria, urinary frequency, hematuria, vaginal discharge.  Patient does take Wegovy .  No current facility-administered medications for this encounter.  Current Outpatient Medications:    metFORMIN  (GLUCOPHAGE -XR) 500 MG 24 hr tablet, Take 1 tablet (500 mg total) by mouth daily with breakfast., Disp: 90 tablet, Rfl: 1   ondansetron  (ZOFRAN ) 4 MG tablet, Take 1 tablet (4 mg total) by mouth every 8 (eight) hours as needed., Disp: 30 tablet, Rfl: 0   Probiotic Product (UP4 PROBIOTICS PO), Take by mouth., Disp: , Rfl:    semaglutide -weight management (WEGOVY ) 1.7 MG/0.75ML SOAJ SQ injection, Inject 1.7 mg into the skin once a week., Disp: 3 mL, Rfl: 0   triamcinolone  cream (KENALOG ) 0.1 %, Apply 1 Application topically 2 (two) times daily. (Patient not taking: No sig reported), Disp: 30 g, Rfl: 0   Allergies  Allergen Reactions   Ciprofloxacin  Nausea Only    Past Medical History:  Diagnosis Date   Anemia    Anemia in pregnancy, third trimester 03/06/2019   Hgb 9.8 at 28 week visit Rx for Ferrous Sulfate  ordered   Chlamydia    Depression    PCOS (polycystic ovarian syndrome) 07/2022   Post-dates pregnancy 05/29/2019   Pregnancy affected by fetal growth restriction 05/29/2019   Short cervical length during pregnancy in second trimester 01/20/2019   2.7 cm on 01/20/19   Supervision of low-risk  pregnancy 11/12/2018   BABYSCRIPTS PATIENT: [x ]Initial [x ]12 [x20 [x] 28 [ ] 32 [ ] 36 [ ] 38 [ ] 39 [ ] 40  Nursing Staff Provider Office Location  [facility] Dating   LMP and 6.1 week US  Language   English Anatomy US    Normal  Flu Vaccine  12/04/2018 Genetic Screen   NIPS: low risk  AFP:   negative  TDaP vaccine   03/05/19 Hgb A1C or  GTT Early 5.2 Third trimester 79-108-93 Rhogam   N/A   LAB RESULTS  Feeding Plan  Bottle Bloo   UTI (lower urinary tract infection)      Past Surgical History:  Procedure Laterality Date   NO PAST SURGERIES      Family History  Problem Relation Age of Onset   Hypertension Other    Healthy Mother    Healthy Father     Social History   Tobacco Use   Smoking status: Never    Passive exposure: Never   Smokeless tobacco: Never  Vaping Use   Vaping status: Former  Substance Use Topics   Alcohol use: Not Currently   Drug use: Not Currently    ROS   Objective:   Vitals: BP 112/78 (BP Location: Right Arm)   Pulse (!) 101   Temp 99.1 F (37.3 C) (Oral)   Resp 16   LMP 11/21/2023 (Exact Date)   SpO2 96%   Physical Exam Constitutional:      General: She is not in acute distress.  Appearance: Normal appearance. She is well-developed. She is not ill-appearing, toxic-appearing or diaphoretic.  HENT:     Head: Normocephalic and atraumatic.     Nose: Nose normal.     Mouth/Throat:     Mouth: Mucous membranes are moist.     Pharynx: Oropharynx is clear.  Eyes:     General: No scleral icterus.       Right eye: No discharge.        Left eye: No discharge.     Extraocular Movements: Extraocular movements intact.     Conjunctiva/sclera: Conjunctivae normal.  Cardiovascular:     Rate and Rhythm: Normal rate.  Pulmonary:     Effort: Pulmonary effort is normal.  Abdominal:     General: Bowel sounds are normal. There is no distension.     Palpations: Abdomen is soft. There is no mass.     Tenderness: There is no abdominal tenderness. There is no  right CVA tenderness, left CVA tenderness, guarding or rebound.  Skin:    General: Skin is warm and dry.  Neurological:     General: No focal deficit present.     Mental Status: She is alert and oriented to person, place, and time.  Psychiatric:        Mood and Affect: Mood normal.        Behavior: Behavior normal.        Thought Content: Thought content normal.        Judgment: Judgment normal.    Results for orders placed or performed during the hospital encounter of 12/18/23 (from the past 24 hours)  POCT urine pregnancy     Status: None   Collection Time: 12/18/23  7:26 PM  Result Value Ref Range   Preg Test, Ur Negative Negative    Assessment and Plan :   PDMP not reviewed this encounter.  1. Exposure to syphilis   2. Screen for STD (sexually transmitted disease)   3. Nausea and vomiting, unspecified vomiting type    Recommend supportive care with Zofran .  Wegovy  is definitely a possibility as a primary source of her symptoms.  Labs pending, will treat as appropriate based off her results.  Counseled patient on potential for adverse effects with medications prescribed/recommended today, ER and return-to-clinic precautions discussed, patient verbalized understanding.    Jill Neal, [place] 12/18/23 1950

## 2023-12-18 NOTE — ED Triage Notes (Signed)
 Pt requested Syphilis test as she had a reexposure.   Pt reports nausea and vomiting at night x 2-3 days.

## 2023-12-19 LAB — CERVICOVAGINAL ANCILLARY ONLY
Chlamydia: NEGATIVE
Comment: NEGATIVE
Comment: NEGATIVE
Comment: NORMAL
Neisseria Gonorrhea: NEGATIVE
Trichomonas: NEGATIVE

## 2023-12-20 LAB — RPR: RPR Ser Ql: NONREACTIVE

## 2023-12-20 LAB — HIV ANTIBODY (ROUTINE TESTING W REFLEX): HIV Screen 4th Generation wRfx: NONREACTIVE

## 2023-12-21 LAB — MISC LABCORP TEST (SEND OUT): Labcorp test code: 180076

## 2023-12-23 ENCOUNTER — Other Ambulatory Visit: Payer: Self-pay

## 2023-12-23 ENCOUNTER — Encounter (HOSPITAL_COMMUNITY): Payer: Self-pay

## 2023-12-23 ENCOUNTER — Emergency Department (HOSPITAL_COMMUNITY)
Admission: EM | Admit: 2023-12-23 | Discharge: 2023-12-23 | Disposition: A | Discharge status: Home / Self Care | Attending: Emergency Medicine | Admitting: Emergency Medicine

## 2023-12-23 DIAGNOSIS — R112 Nausea with vomiting, unspecified: Secondary | ICD-10-CM

## 2023-12-23 DIAGNOSIS — E876 Hypokalemia: Secondary | ICD-10-CM | POA: Diagnosis not present

## 2023-12-23 LAB — CBC WITH DIFFERENTIAL/PLATELET
Abs Immature Granulocytes: 0.01 K/uL (ref 0.00–0.07)
Basophils Absolute: 0 K/uL (ref 0.0–0.1)
Basophils Relative: 0 %
Eosinophils Absolute: 0.1 K/uL (ref 0.0–0.5)
Eosinophils Relative: 1 %
HCT: 39.9 % (ref 36.0–46.0)
Hemoglobin: 11.9 g/dL — ABNORMAL LOW (ref 12.0–15.0)
Immature Granulocytes: 0 %
Lymphocytes Relative: 35 %
Lymphs Abs: 2.9 K/uL (ref 0.7–4.0)
MCH: 23.5 pg — ABNORMAL LOW (ref 26.0–34.0)
MCHC: 29.8 g/dL — ABNORMAL LOW (ref 30.0–36.0)
MCV: 78.9 fL — ABNORMAL LOW (ref 80.0–100.0)
Monocytes Absolute: 0.6 K/uL (ref 0.1–1.0)
Monocytes Relative: 8 %
Neutro Abs: 4.6 K/uL (ref 1.7–7.7)
Neutrophils Relative %: 56 %
Platelets: 352 K/uL (ref 150–400)
RBC: 5.06 MIL/uL (ref 3.87–5.11)
RDW: 17.2 % — ABNORMAL HIGH (ref 11.5–15.5)
WBC: 8.2 K/uL (ref 4.0–10.5)
nRBC: 0 % (ref 0.0–0.2)

## 2023-12-23 LAB — COMPREHENSIVE METABOLIC PANEL WITH GFR
ALT: 19 U/L (ref 0–44)
AST: 21 U/L (ref 15–41)
Albumin: 4.4 g/dL (ref 3.5–5.0)
Alkaline Phosphatase: 89 U/L (ref 38–126)
Anion gap: 14 (ref 5–15)
BUN: 12 mg/dL (ref 6–20)
CO2: 25 mmol/L (ref 22–32)
Calcium: 9.8 mg/dL (ref 8.9–10.3)
Chloride: 102 mmol/L (ref 98–111)
Creatinine, Ser: 0.89 mg/dL (ref 0.44–1.00)
GFR, Estimated: >60 mL/min (ref >60)
Glucose, Bld: 87 mg/dL (ref 70–99)
Potassium: 3.3 mmol/L — ABNORMAL LOW (ref 3.5–5.1)
Sodium: 140 mmol/L (ref 135–145)
Total Bilirubin: 0.7 mg/dL (ref 0.0–1.2)
Total Protein: 8.4 g/dL — ABNORMAL HIGH (ref 6.5–8.1)

## 2023-12-23 LAB — URINALYSIS, ROUTINE W REFLEX MICROSCOPIC
Bilirubin Urine: NEGATIVE
Glucose, UA: 50 mg/dL — AB
Ketones, ur: 5 mg/dL — AB
Leukocytes,Ua: NEGATIVE
Nitrite: POSITIVE — AB
Protein, ur: >=300 mg/dL — AB
Specific Gravity, Urine: 1.031 — ABNORMAL HIGH (ref 1.005–1.030)
pH: 5 (ref 5.0–8.0)

## 2023-12-23 LAB — LIPASE, BLOOD: Lipase: 25 U/L (ref 11–51)

## 2023-12-23 LAB — HCG, SERUM, QUALITATIVE: Preg, Serum: NEGATIVE

## 2023-12-23 LAB — SARS CORONAVIRUS 2 BY RT PCR: SARS Coronavirus 2 by RT PCR: NEGATIVE

## 2023-12-23 MED ORDER — ONDANSETRON HCL 4 MG/2ML IJ SOLN
4.0000 mg | Freq: Once | INTRAMUSCULAR | Status: AC
  Administered 2023-12-23: 4 mg via INTRAVENOUS
  Filled 2023-12-23: qty 2

## 2023-12-23 MED ORDER — PROCHLORPERAZINE MALEATE 10 MG PO TABS
10.0000 mg | ORAL_TABLET | Freq: Four times a day (QID) | ORAL | 0 refills | Status: DC | PRN
Start: 2023-12-23 — End: 2024-01-21

## 2023-12-23 MED ORDER — ONDANSETRON 4 MG PO TBDP: 4.0000 mg | ORAL_TABLET | Freq: Once | ORAL | Status: DC | PRN

## 2023-12-23 MED ORDER — SODIUM CHLORIDE 0.9 % IV BOLUS
1000.0000 mL | Freq: Once | INTRAVENOUS | Status: AC
  Administered 2023-12-23: 1000 mL via INTRAVENOUS

## 2023-12-23 NOTE — Discharge Instructions (Addendum)
 You may double up on the ondansetron  (Zofran ) which she had been prescribed this past week.  If that is not helping, you may take the prochlorperazine which I have prescribed for you today.  Return to the emergency department if symptoms are getting worse.

## 2023-12-23 NOTE — ED Triage Notes (Signed)
 PT states she has been having N/V x7 days. PT states that past two days she has started having chest pain and SHOB she also states she is on her period and is having heavier than normal bleeding.

## 2023-12-23 NOTE — ED Provider Notes (Signed)
 [place] EMERGENCY DEPARTMENT AT [facility] Provider Note   CSN: [id number] Arrival date & time: 12/23/23  0344     Patient presents with: Nausea, Emesis, and Shortness of Breath   Jill Neal is a 23 y.o. female.   The history is provided by the patient.  Emesis Shortness of Breath Associated symptoms: vomiting    She has history of polycystic ovarian syndrome and comes in with nausea and vomiting for the last 7 days.  She states she has not been able to hold anything down.  She denies any blood in the emesis.  There has been no diarrhea.  There has been some abdominal cramping just prior to vomiting but no true abdominal pain.  She denies fever, chills, sweats.  She is currently on her menses which came several days late and has increased amount of flow compared with normal.  She is not using any contraception, does endorse some breast swelling and tenderness.    Prior to Admission medications   Medication Sig Start Date End Date Taking? Authorizing Provider  metFORMIN  (GLUCOPHAGE -XR) 500 MG 24 hr tablet Take 1 tablet (500 mg total) by mouth daily with breakfast. 07/27/23   McElwee, Lauren A, NP  ondansetron  (ZOFRAN ) 4 MG tablet Take 1 tablet (4 mg total) by mouth every 8 (eight) hours as needed. 11/20/23   McElwee, Lauren A, NP  ondansetron  (ZOFRAN -ODT) 4 MG disintegrating tablet Take 1 tablet (4 mg total) by mouth every 8 (eight) hours as needed for nausea or vomiting. 12/18/23   Christopher Savannah, PA-C  Probiotic Product (UP4 PROBIOTICS PO) Take by mouth.    [provider]  semaglutide -weight management (WEGOVY ) 1.7 MG/0.75ML SOAJ SQ injection Inject 1.7 mg into the skin once a week. 11/23/23   McElwee, Lauren A, NP  triamcinolone  cream (KENALOG ) 0.1 % Apply 1 Application topically 2 (two) times daily. Patient not taking: No sig reported 07/27/23   McElwee, Lauren A, NP    Allergies: Ciprofloxacin     Review of Systems  Respiratory:  Positive for shortness  of breath.   Gastrointestinal:  Positive for vomiting.  All other systems reviewed and are negative.   Updated Vital Signs BP (!) 135/94 (BP Location: Left Arm)   Pulse (!) 107   Temp 98.3 F (36.8 C) (Oral)   Resp 18   LMP 12/23/2023 (Exact Date)   SpO2 100%   Physical Exam Vitals and nursing note reviewed.   23 year old female, resting comfortably and in no acute distress. Vital signs are significant for borderline elevated blood pressure and mildly elevated heart rate. Oxygen saturation is 100%, which is normal. Head is normocephalic and atraumatic. PERRLA, EOMI. Oropharynx is clear. Neck is nontender and supple without adenopathy. Lungs are clear without rales, wheezes, or rhonchi. Chest is nontender. Heart has regular rate and rhythm without murmur. Abdomen is soft, flat, nontender. Extremities have no cyanosis or edema, full range of motion is present. Skin is warm and dry without rash. Neurologic: Mental status is normal, cranial nerves are intact, moves all extremities equally.  (all labs ordered are listed, but only abnormal results are displayed) Labs Reviewed  COMPREHENSIVE METABOLIC PANEL WITH GFR - Abnormal; Notable for the following components:      Result Value   Potassium 3.3 (*)    Total Protein 8.4 (*)    All other components within normal limits  URINALYSIS, ROUTINE W REFLEX MICROSCOPIC - Abnormal; Notable for the following components:   APPearance CLOUDY (*)  Specific Gravity, Urine 1.031 (*)    Glucose, UA 50 (*)    Hgb urine dipstick LARGE (*)    Ketones, ur 5 (*)    Protein, ur >=300 (*)    Nitrite POSITIVE (*)    All other components within normal limits  CBC WITH DIFFERENTIAL/PLATELET - Abnormal; Notable for the following components:   Hemoglobin 11.9 (*)    MCV 78.9 (*)    MCH 23.5 (*)    MCHC 29.8 (*)    RDW 17.2 (*)    All other components within normal limits  SARS CORONAVIRUS 2 BY RT PCR  LIPASE, BLOOD  HCG, SERUM, QUALITATIVE     EKG: EKG Interpretation Date/Time:  Sunday December 23 2023 03:54:08 EDT Ventricular Rate:  114 PR Interval:  113 QRS Duration:  79 QT Interval:  313 QTC Calculation: 431 R Axis:   90  Text Interpretation: Sinus tachycardia Borderline right axis deviation Borderline repolarization abnormality When compared with ECG of 01/26/2019, HEART RATE has increased REPOLARIZATION ABNORMALITY is now present Confirmed by Bashar Milam ([id number]) on 12/23/2023 4:31:59 AM  Radiology: No results found.   Procedures  Cardiac monitor shows normal sinus rhythm, per my interpretation. Medications Ordered in the ED  ondansetron (ZOFRAN-ODT) disintegrating tablet 4 mg (has no administration in time range)                                    Medical Decision Making Amount and/or Complexity of Data Reviewed Labs: ordered.  Risk Prescription drug management.   Vomiting for 1 in the past 7 days.  This is a presentation with a wide range of treatment options and carries with it a high risk of morbidity and complications.  Differential diagnosis includes, but is not limited to, vomiting of pregnancy, viral gastritis, small bowel obstruction, cholecystitis, pancreatitis.  I have ordered IV fluids, ondansetron for nausea, screening labs including pregnancy test.  I have reviewed her past records and note urgent care visit on 1020 02/2023 at which time she was complaining of vomiting but pregnancy test was negative.  I have reviewed her laboratory tests, my interpretation is not pregnant, mild hypokalemia likely secondary to vomiting, normal lipase, is stable anemia with hemoglobin actually having increased slightly compared with last value, negative PCR test for COVID-19.  She states she feels much better following above-noted treatment.  I have recommended that she try doubling her dose of ondansetron, but I am also giving her prescription for prochlorperazine.  Return precautions discussed.     Final  diagnoses:  Nausea and vomiting, unspecified vomiting type  Hypokalemia due to excessive gastrointestinal loss of potassium    ED Discharge Orders          Ordered    prochlorperazine (COMPAZINE) 10 MG tablet  Every 6 hours PRN        10 /26/25 0610               Raford Lenis, MD 12/23/23 [phone]

## 2023-12-24 ENCOUNTER — Telehealth: Payer: Self-pay

## 2023-12-24 NOTE — Transitions of Care (Post Inpatient/ED Visit) (Signed)
   12/24/2023  Name: Jill Neal MRN: [id number] DOB: [birth year]  Today's TOC FU Call Status: Today's TOC FU Call Status:: Unsuccessful Call (1st Attempt) Unsuccessful Call (1st Attempt) Date: 12/24/23  Attempted to reach the patient regarding the most recent Inpatient/ED visit.  Follow Up Plan: Additional outreach attempts will be made to reach the patient to complete the Transitions of Care (Post Inpatient/ED visit) call.   Signature  Ulanda Prose, RMA

## 2024-01-17 ENCOUNTER — Encounter: Payer: Self-pay | Admitting: Nurse Practitioner

## 2024-01-17 ENCOUNTER — Other Ambulatory Visit (INDEPENDENT_AMBULATORY_CARE_PROVIDER_SITE_OTHER)

## 2024-01-17 DIAGNOSIS — Z202 Contact with and (suspected) exposure to infections with a predominantly sexual mode of transmission: Secondary | ICD-10-CM | POA: Diagnosis not present

## 2024-01-17 DIAGNOSIS — E669 Obesity, unspecified: Secondary | ICD-10-CM

## 2024-01-17 NOTE — Telephone Encounter (Signed)
 Requesting: semaglutide -weight management (WEGOVY ) 1.7 MG/0.75ML SOAJ SQ injection  Last Visit: 10/10/2023 Next Visit: Visit date not found Last Refill: 11/23/2023  Please Advise

## 2024-01-18 LAB — SYPHILIS: RPR W/REFLEX TO RPR TITER AND TREPONEMAL ANTIBODIES, TRADITIONAL SCREENING AND DIAGNOSIS ALGORITHM: RPR Ser Ql: NONREACTIVE

## 2024-01-21 ENCOUNTER — Ambulatory Visit: Payer: Self-pay | Admitting: Nurse Practitioner

## 2024-01-21 ENCOUNTER — Ambulatory Visit

## 2024-01-21 ENCOUNTER — Ambulatory Visit: Admitting: Nurse Practitioner

## 2024-01-21 VITALS — BP 110/80 | HR 91 | Temp 97.1°F | Ht 67.0 in | Wt 220.2 lb

## 2024-01-21 DIAGNOSIS — Z6834 Body mass index (BMI) 34.0-34.9, adult: Secondary | ICD-10-CM

## 2024-01-21 DIAGNOSIS — F339 Major depressive disorder, recurrent, unspecified: Secondary | ICD-10-CM | POA: Diagnosis not present

## 2024-01-21 DIAGNOSIS — D649 Anemia, unspecified: Secondary | ICD-10-CM | POA: Diagnosis not present

## 2024-01-21 DIAGNOSIS — R202 Paresthesia of skin: Secondary | ICD-10-CM | POA: Diagnosis not present

## 2024-01-21 DIAGNOSIS — R42 Dizziness and giddiness: Secondary | ICD-10-CM

## 2024-01-21 DIAGNOSIS — R200 Anesthesia of skin: Secondary | ICD-10-CM | POA: Diagnosis not present

## 2024-01-21 DIAGNOSIS — E669 Obesity, unspecified: Secondary | ICD-10-CM | POA: Diagnosis not present

## 2024-01-21 DIAGNOSIS — R55 Syncope and collapse: Secondary | ICD-10-CM

## 2024-01-21 LAB — CBC WITH DIFFERENTIAL/PLATELET
Basophils Absolute: 0 K/uL (ref 0.0–0.1)
Basophils Relative: 0.5 % (ref 0.0–3.0)
Eosinophils Absolute: 0.1 K/uL (ref 0.0–0.7)
Eosinophils Relative: 0.9 % (ref 0.0–5.0)
HCT: 32.2 % — ABNORMAL LOW (ref 36.0–46.0)
Hemoglobin: 10.4 g/dL — ABNORMAL LOW (ref 12.0–15.0)
Lymphocytes Relative: 31.3 % (ref 12.0–46.0)
Lymphs Abs: 2.5 K/uL (ref 0.7–4.0)
MCHC: 32.2 g/dL (ref 30.0–36.0)
MCV: 77.2 fl — ABNORMAL LOW (ref 78.0–100.0)
Monocytes Absolute: 0.5 K/uL (ref 0.1–1.0)
Monocytes Relative: 5.9 % (ref 3.0–12.0)
Neutro Abs: 4.9 K/uL (ref 1.4–7.7)
Neutrophils Relative %: 61.4 % (ref 43.0–77.0)
Platelets: 305 K/uL (ref 150.0–400.0)
RBC: 4.17 Mil/uL (ref 3.87–5.11)
RDW: 17.5 % — ABNORMAL HIGH (ref 11.5–15.5)
WBC: 8 K/uL (ref 4.0–10.5)

## 2024-01-21 LAB — COMPREHENSIVE METABOLIC PANEL WITH GFR
ALT: 25 U/L (ref 0–35)
AST: 23 U/L (ref 0–37)
Albumin: 4 g/dL (ref 3.5–5.2)
Alkaline Phosphatase: 67 U/L (ref 39–117)
BUN: 11 mg/dL (ref 6–23)
CO2: 28 meq/L (ref 19–32)
Calcium: 9.2 mg/dL (ref 8.4–10.5)
Chloride: 106 meq/L (ref 96–112)
Creatinine, Ser: 0.73 mg/dL (ref 0.40–1.20)
GFR: 115.99 mL/min (ref >60.00)
Glucose, Bld: 85 mg/dL (ref 70–99)
Potassium: 3.7 meq/L (ref 3.5–5.1)
Sodium: 140 meq/L (ref 135–145)
Total Bilirubin: 0.3 mg/dL (ref 0.2–1.2)
Total Protein: 7 g/dL (ref 6.0–8.3)

## 2024-01-21 LAB — TSH: TSH: 0.38 u[IU]/mL (ref 0.35–5.50)

## 2024-01-21 LAB — VITAMIN D 25 HYDROXY (VIT D DEFICIENCY, FRACTURES): VITD: 17.25 ng/mL — ABNORMAL LOW (ref 30.00–100.00)

## 2024-01-21 LAB — IRON: Iron: 32 ug/dL — ABNORMAL LOW (ref 42–145)

## 2024-01-21 LAB — FERRITIN: Ferritin: 5.6 ng/mL — ABNORMAL LOW (ref 10.0–291.0)

## 2024-01-21 LAB — POCT URINE PREGNANCY: Preg Test, Ur: NEGATIVE

## 2024-01-21 LAB — HEMOGLOBIN A1C: Hgb A1c MFr Bld: 5.6 % (ref 4.6–6.5)

## 2024-01-21 LAB — VITAMIN B12: Vitamin B-12: 176 pg/mL — ABNORMAL LOW (ref 211–911)

## 2024-01-21 MED ORDER — ESCITALOPRAM OXALATE 10 MG PO TABS
10.0000 mg | ORAL_TABLET | Freq: Every day | ORAL | 1 refills | Status: AC
Start: 2024-01-21 — End: ?

## 2024-01-21 NOTE — Assessment & Plan Note (Signed)
 Numbness in the left leg has persisted for a month. There is no weakness, back pain, or history of trauma, and skin sensitivity has resolved. An x-ray of the back and labs, including B12 and iron counts, vitamin B12 have been ordered. Pregnancy test negative.

## 2024-01-21 NOTE — Assessment & Plan Note (Signed)
 She experiences lightheadedness and dizziness during activities. Recent labs have been normal. Encourage fluids. Will place referral to cardiology.

## 2024-01-21 NOTE — Assessment & Plan Note (Signed)
 BMI 34.5. Weight increased after stopping Wegovy  due to cost. She is unable to cash pay for this. Continue focus on nutrition and exercise.

## 2024-01-21 NOTE — Progress Notes (Signed)
 Established Patient Office Visit  Subjective   Patient ID: Jill Neal, female    DOB: [birth year]  Age: 23 y.o. MRN: [id number]  Chief Complaint  Patient presents with   Numbness    Feeling numbness in left leg for 1 month, want to start back on lexapro , discuss weight loss options    HPI Discussed the use of AI scribe software for clinical note transcription with the patient, who gave verbal consent to proceed.  History of Present Illness   Jill Neal is a 23 year old female who presents with numbness in her left leg and requests to restart Lexapro  for depression and anxiety.   Numbness in the left leg has been present for about a month, primarily in the lower part but also affecting the upper leg. The numbness is noticeable when applying lotion or washing and is accompanied by itchiness. There is no weakness, back pain, or difficulty moving the leg. A transient period of painful skin sensitivity in the same area has resolved. No history of falls, injuries, or heavy lifting.  Symptoms of depression and anxiety include feeling down, crying spells, and 'spacing out' during activities. She experiences hypersomnia and wakes up feeling unrefreshed. She has not been taking Lexapro  recently and wishes to restart it.  She has experienced weight loss of 23 pounds since August, with recent weight gain after stopping Wegovy  due to a stomach virus. Increased appetite is noted since stopping Wegovy . She is not currently taking weight loss medications.  Lightheadedness occurs during showers or grocery shopping, with a sensation of near syncope. She has been drinking plenty of fluids.        01/21/2024    1:41 PM 10/10/2023    2:00 PM 07/27/2023    2:19 PM 08/23/2022    3:54 PM 05/28/2019    2:07 PM  Depression screen PHQ 2/9  Decreased Interest 3 1 3 3 2   Down, Depressed, Hopeless 3 2 2 2 1   PHQ - 2 Score 6 3 5 5 3   Altered sleeping 2 2 3 2 3   Tired, decreased energy 3 1 3 3 3    Change in appetite 3 0 3 3 1   Feeling bad or failure about yourself  3 2 3 3 1   Trouble concentrating 2 0 3 2 0  Moving slowly or fidgety/restless 2 0 1 1 0  Suicidal thoughts 1 0 1 1   PHQ-9 Score 22 8  22  20  11    Difficult doing work/chores Very difficult Very difficult Extremely dIfficult Extremely dIfficult      Data saved with a previous flowsheet row definition      01/21/2024    1:42 PM 10/10/2023    2:01 PM 07/27/2023    2:20 PM 08/23/2022    3:55 PM  GAD 7 : Generalized Anxiety Score  Nervous, Anxious, on Edge 2 3 1 2   Control/stop worrying 2 1 2 2   Worry too much - different things 3 2 3 3   Trouble relaxing 1 0 1 3  Restless 0 1 0 1  Easily annoyed or irritable 3 3 3 2   Afraid - awful might happen 1 2 1 1   Total GAD 7 Score 12 12 11 14   Anxiety Difficulty Extremely difficult Very difficult Somewhat difficult Somewhat difficult      ROS See pertinent positives and negatives per HPI.    Objective:     BP 110/80 (BP Location: Left Arm, Patient Position: Sitting, Cuff Size: Normal)  Pulse 91   Temp (!) 97.1 F (36.2 C)   Ht 5' 7 (1.702 m)   Wt 220 lb 3.2 oz (99.9 kg)   LMP 12/21/2023 (Exact Date)   SpO2 98%   BMI 34.49 kg/m  BP Readings from Last 3 Encounters:  01/21/24 110/80  12/23/23 109/74  12/18/23 112/78   Wt Readings from Last 3 Encounters:  01/21/24 220 lb 3.2 oz (99.9 kg)  10/10/23 243 lb 12.8 oz (110.6 kg)  08/29/23 258 lb 3.2 oz (117.1 kg)      Physical Exam Vitals and nursing note reviewed.  Constitutional:      General: She is not in acute distress.    Appearance: Normal appearance.  HENT:     Head: Normocephalic.  Eyes:     Conjunctiva/sclera: Conjunctivae normal.     Pupils: Pupils are equal, round, and reactive to light.  Cardiovascular:     Rate and Rhythm: Normal rate and regular rhythm.     Pulses: Normal pulses.     Heart sounds: Normal heart sounds.  Pulmonary:     Effort: Pulmonary effort is normal.     Breath  sounds: Normal breath sounds.  Musculoskeletal:     Cervical back: Normal range of motion.  Skin:    General: Skin is warm.     Findings: No erythema or rash.  Neurological:     General: No focal deficit present.     Mental Status: She is alert and oriented to person, place, and time.     Cranial Nerves: No cranial nerve deficit.     Motor: No weakness.     Coordination: Coordination normal.     Gait: Gait normal.     Deep Tendon Reflexes: Reflexes normal.  Psychiatric:        Mood and Affect: Mood normal.        Behavior: Behavior normal.        Thought Content: Thought content normal.        Judgment: Judgment normal.    Results for orders placed or performed in visit on 01/21/24  POCT urine pregnancy  Result Value Ref Range   Preg Test, Ur Negative Negative      Assessment & Plan:   Problem List Items Addressed This Visit       Cardiovascular and Mediastinum   Postural dizziness with presyncope   She experiences lightheadedness and dizziness during activities. Recent labs have been normal. Encourage fluids. Will place referral to cardiology.       Relevant Orders   Ambulatory referral to Cardiology     Other   Depression, recurrent - Primary   Chronic, not controlled. She reports depression and anxiety with low mood and concentration issues, but no panic attacks or significant sleep disturbances. Lexapro  10mg  has been started at one tablet daily. Follow-up in 4-6 weeks.       Relevant Medications   escitalopram  (LEXAPRO ) 10 MG tablet   Anemia   Previous low blood counts with no current iron supplementation. Anemia may contribute to symptoms. Check CBC, iron, ferritin, B12 today.       Relevant Orders   CBC with Differential/Platelet   Vitamin B12   Ferritin   Iron   Obesity (BMI 30-39.9)   BMI 34.5. Weight increased after stopping Wegovy  due to cost. She is unable to cash pay for this. Continue focus on nutrition and exercise.       Numbness in left leg    Numbness in the left leg  has persisted for a month. There is no weakness, back pain, or history of trauma, and skin sensitivity has resolved. An x-ray of the back and labs, including B12 and iron counts, vitamin B12 have been ordered. Pregnancy test negative.       Relevant Orders   CBC with Differential/Platelet   Comprehensive metabolic panel with GFR   Vitamin B12   TSH   VITAMIN D  25 Hydroxy (Vit-D Deficiency, Fractures)   Hemoglobin A1c   DG Lumbar Spine Complete   POCT urine pregnancy (Completed)   Return in about 4 weeks (around 02/18/2024) for 4-6 weeks numbness, depression.    Tinnie DELENA Harada, NP

## 2024-01-21 NOTE — Assessment & Plan Note (Signed)
 Chronic, not controlled. She reports depression and anxiety with low mood and concentration issues, but no panic attacks or significant sleep disturbances. Lexapro  10mg  has been started at one tablet daily. Follow-up in 4-6 weeks.

## 2024-01-21 NOTE — Patient Instructions (Signed)
 It was great to see you!  We are checking your labs today and will let you know the results via mychart/phone.   I have ordered an xray of your back   Start lexapro  1 tablet daily   I have placed a referral to cardiology for the lightheadedness   Let's follow-up in 4-6 weeks, sooner if you have concerns.  If a referral was placed today, you will be contacted for an appointment. Please note that routine referrals can sometimes take up to 3-4 weeks to process. Please call our office if you haven't heard anything after this time frame.  Take care,  Tinnie Harada, NP

## 2024-01-21 NOTE — Assessment & Plan Note (Signed)
 Previous low blood counts with no current iron supplementation. Anemia may contribute to symptoms. Check CBC, iron, ferritin, B12 today.

## 2024-02-24 ENCOUNTER — Other Ambulatory Visit: Payer: Self-pay | Admitting: Nurse Practitioner

## 2024-02-25 NOTE — Telephone Encounter (Signed)
 Requesting: WEGOVY  0.25MG /0.5ML INJ ( 4 PENS)  Last Visit: 01/21/2024 Next Visit: 03/12/2024 Last Refill: 11/23/2023  Please Advise   Patient not taking

## 2024-02-26 ENCOUNTER — Ambulatory Visit: Admitting: Nurse Practitioner

## 2024-03-04 ENCOUNTER — Ambulatory Visit: Payer: Self-pay

## 2024-03-04 NOTE — Telephone Encounter (Signed)
 Attempted to contact pt to f/u on request but NA. LM for her to return call to clinic. Attempt #2. Will route to office at this time.     Copied from CRM [id number]. Topic: Clinical - Medication Question >> Mar 04, 2024  4:00 PM Rea C wrote: Reason for CRM: cephalexin  500mg . Patient called in for a refill for this prescription but it is not listed in her current medication chart.   [id number] [address], [place] - [phone] AT [facility] OF [facility] & [address] [place] [address] Phone: [phone] Fax: [phone] Hours: Not open 24 hours  [phone]

## 2024-03-04 NOTE — Telephone Encounter (Signed)
 Attempted to contact pt to f/u on request but NA. LM for her to return call to clinic. Attempt #1    Copied from CRM #[id number]. Topic: Clinical - Medication Question >> Mar 04, 2024  4:00 PM Leah C wrote: Reason for CRM: cephalexin  500mg . Patient called in for a refill for this prescription but it is not listed in her current medication chart.   [id number] [address], [place] - [phone] AT [facility] OF [facility] & [address] [place] [address] Phone: [phone] Fax: [phone] Hours: Not open 24 hours  [phone]

## 2024-03-05 NOTE — Telephone Encounter (Signed)
 Left message to call the office to schedule an appointment

## 2024-03-12 ENCOUNTER — Ambulatory Visit: Admitting: Nurse Practitioner

## 2024-03-18 ENCOUNTER — Ambulatory Visit: Payer: Self-pay

## 2024-03-19 ENCOUNTER — Ambulatory Visit
Admission: EM | Admit: 2024-03-19 | Discharge: 2024-03-19 | Disposition: A | Discharge status: Home / Self Care | Attending: Family Medicine | Admitting: Family Medicine

## 2024-03-19 DIAGNOSIS — Z3200 Encounter for pregnancy test, result unknown: Secondary | ICD-10-CM | POA: Diagnosis not present

## 2024-03-19 DIAGNOSIS — Z3201 Encounter for pregnancy test, result positive: Secondary | ICD-10-CM | POA: Diagnosis not present

## 2024-03-19 LAB — POCT URINE PREGNANCY: Preg Test, Ur: POSITIVE — AB

## 2024-03-19 NOTE — Discharge Instructions (Signed)
 Your pregnancy test was positive and therefore I recommend seeking a consultation with an obstetrician.  Please start a prenatal vitamin.  Avoid any alcohol use.  Do not use any nonsteroidal anti-inflammatories (NSAIDs) like ibuprofen, Motrin, naproxen, Aleve, etc. which are all available over-the-counter.  Please just use Tylenol  at a dose of 500mg -650mg  once every 6 hours as needed for your aches, pains, fevers.

## 2024-03-19 NOTE — ED Provider Notes (Signed)
 " [facility], [facility] - [facility]  Note:  This document was prepared using Conservation officer, historic buildings and may include unintentional dictation errors.  MRN: [id number] DOB: [birth year]  Subjective:   Jill Neal is a 24 y.o. female presenting for amenorrhea.  Would like pregnancy test.  Denies fever, n/v, abdominal pain, pelvic pain, rashes, dysuria, urinary frequency, hematuria, vaginal discharge.    Current Outpatient Medications  Medication Instructions   escitalopram  (LEXAPRO ) 10 mg, Oral, Daily   ondansetron  (ZOFRAN -ODT) 4 mg, Oral, Every 8 hours PRN   Probiotic Product (UP4 PROBIOTICS PO) Take by mouth.   triamcinolone  cream (KENALOG ) 0.1 % 1 Application, Topical, 2 times daily   VITAMIN D  PO Daily    Allergies[1]  Past Medical History:  Diagnosis Date   Anemia    Anemia in pregnancy, third trimester 03/06/2019   Hgb 9.8 at 28 week visit Rx for Ferrous Sulfate  ordered   Chlamydia    Depression    PCOS (polycystic ovarian syndrome) 07/2022   Post-dates pregnancy 05/29/2019   Pregnancy affected by fetal growth restriction 05/29/2019   Short cervical length during pregnancy in second trimester 01/20/2019   2.7 cm on 01/20/19   Supervision of low-risk pregnancy 11/12/2018   BABYSCRIPTS PATIENT: [x ]Initial [x ]12 [x20 [x] 28 [ ] 32 [ ] 36 [ ] 38 [ ] 39 [ ] 40  Nursing Staff Provider Office Location  [facility] Dating   LMP and 6.1 week US  Language   English Anatomy US    Normal  Flu Vaccine  12/04/2018 Genetic Screen   NIPS: low risk  AFP:   negative  TDaP vaccine   03/05/19 Hgb A1C or  GTT Early 5.2 Third trimester 79-108-93 Rhogam   N/A   LAB RESULTS  Feeding Plan  Bottle Bloo   UTI (lower urinary tract infection)      Past Surgical History:  Procedure Laterality Date   NO PAST SURGERIES      Family History  Problem Relation Age of Onset   Hypertension Other    Healthy Mother    Healthy Father     Social History   Occupational History   Not on file  Tobacco  Use   Smoking status: Never    Passive exposure: Never   Smokeless tobacco: Never  Vaping Use   Vaping status: Former  Substance and Sexual Activity   Alcohol use: Not Currently   Drug use: Not Currently   Sexual activity: Yes    Birth control/protection: None     ROS   Objective:   Vitals: BP 114/76 (BP Location: Right Arm)   Pulse 83   Temp 97.9 F (36.6 C) (Oral)   Resp 20   Ht 5' 7 (1.702 m)   Wt 208 lb (94.3 kg)   LMP 02/06/2024   SpO2 99%   BMI 32.58 kg/m   Physical Exam Constitutional:      General: She is not in acute distress.    Appearance: Normal appearance. She is well-developed. She is not ill-appearing, toxic-appearing or diaphoretic.  HENT:     Head: Normocephalic and atraumatic.     Nose: Nose normal.     Mouth/Throat:     Mouth: Mucous membranes are moist.     Pharynx: Oropharynx is clear.  Eyes:     General: No scleral icterus.       Right eye: No discharge.        Left eye: No discharge.     Extraocular Movements: Extraocular movements intact.  Conjunctiva/sclera: Conjunctivae normal.  Cardiovascular:     Rate and Rhythm: Normal rate.  Pulmonary:     Effort: Pulmonary effort is normal.  Abdominal:     General: Bowel sounds are normal. There is no distension.     Palpations: Abdomen is soft. There is no mass.     Tenderness: There is no abdominal tenderness. There is no right CVA tenderness, left CVA tenderness, guarding or rebound.  Skin:    General: Skin is warm and dry.  Neurological:     General: No focal deficit present.     Mental Status: She is alert and oriented to person, place, and time.  Psychiatric:        Mood and Affect: Mood normal.        Behavior: Behavior normal.        Thought Content: Thought content normal.        Judgment: Judgment normal.     Results for orders placed or performed during the hospital encounter of 03/19/24 (from the past 24 hours)  POCT urine pregnancy     Status: Abnormal   Collection  Time: 03/19/24  3:24 PM  Result Value Ref Range   Preg Test, Ur Positive (A) Negative    Assessment and Plan :   PDMP not reviewed this encounter.  1. Positive pregnancy test   2. Possible pregnancy      Anticipatory guidance provided to patient regarding prenatal care. She is to start a prenatal vitamin and counseled on avoiding alcohol, cigarettes, drug use, caffeine.  Also counseled on medications that are safe in pregnancy over-the-counter. Counseled patient on potential for adverse effects with medications prescribed/recommended today, ER and return-to-clinic precautions discussed, patient verbalized understanding.     [1]  Allergies Allergen Reactions   Ciprofloxacin  Nausea Only     Christopher Savannah, [place] 03/19/24 1528  "

## 2024-03-19 NOTE — ED Triage Notes (Signed)
 Pt states that she is here for a late menstrual cycle. Pt states that she is in need of a pregnancy test. Pt denies any other symptoms.

## 2024-03-24 ENCOUNTER — Ambulatory Visit: Admitting: Nurse Practitioner

## 2024-03-27 ENCOUNTER — Ambulatory Visit: Admitting: Nurse Practitioner

## 2024-03-27 NOTE — Assessment & Plan Note (Signed)
 Numbness in the left leg has persisted for a month. There is no weakness, back pain, or history of trauma, and skin sensitivity has resolved. An x-ray of the back and labs, including B12 and iron counts, vitamin B12 have been ordered. Pregnancy test negative.

## 2024-03-27 NOTE — Progress Notes (Incomplete)
" ° ° °  Established Patient Office Visit Subjective:    Patient ID: Jill Neal, female    DOB: 02-12-2001, 24 y.o.   MRN: [id number]  Chief Complaint No chief complaint on file.    HPI Jill Neal is a 24 y.o. female who presents for follow up for depression.  She was seen in an urgent are on January 21st, 2026 for amenorrhea and her pregnancy test was positive.  I had started her on Lexapro  10mg  daily for depression as she endorsed depressive symptoms associated with anxiety, low moods, and concentration issues.   She additionally mentioned experiencing numbness in her left leg ongoing for 1 month without any recent history of falls, injuries, or heavy lifting.   Review of Systems     Objective:    LMP 02/06/2024   BP Readings from Last 3 Encounters:  03/19/24 114/76  01/21/24 110/80  12/23/23 109/74   Wt Readings from Last 3 Encounters:  03/19/24 208 lb (94.3 kg)  01/21/24 220 lb 3.2 oz (99.9 kg)  10/10/23 243 lb 12.8 oz (110.6 kg)   Physical Exam  No results found for any visits on 03/27/24.   The ASCVD Risk score (Arnett DK, et al., 2019) failed to calculate for the following reasons:   The 2019 ASCVD risk score is only valid for ages 37 to 8   * - Cholesterol units were assumed      Assessment & Plan:   Problem List Items Addressed This Visit   None   Tinnie DELENA Harada, NP  I,Emily Lagle,acting as a scribe for [employer], NP.,have documented all relevant documentation on the behalf of Lauren DELENA Harada, NP.  I, Tinnie DELENA Harada, NP, have reviewed all documentation for this visit. The documentation on 03/27/2024 for the exam, diagnosis, procedures, and orders are all accurate and complete. "

## 2024-03-27 NOTE — Assessment & Plan Note (Signed)
 Chronic, not controlled. She reports depression and anxiety with low mood and concentration issues, but no panic attacks or significant sleep disturbances. Lexapro  10mg  has been started at one tablet daily. Positive pregnancy test 03/19/2024.

## 2024-04-10 ENCOUNTER — Ambulatory Visit: Admitting: Nurse Practitioner
